# Patient Record
Sex: Female | Born: 1940 | Race: White | Hispanic: No | Smoking: Former smoker
Health system: Southern US, Community
[De-identification: ages and names within clinical notes are randomized; demographics above are authoritative.]

## PROBLEM LIST (undated history)

## (undated) DIAGNOSIS — J449 Chronic obstructive pulmonary disease, unspecified: Secondary | ICD-10-CM

## (undated) DIAGNOSIS — T8859XA Other complications of anesthesia, initial encounter: Secondary | ICD-10-CM

## (undated) DIAGNOSIS — Z7983 Long term (current) use of bisphosphonates: Secondary | ICD-10-CM

## (undated) DIAGNOSIS — C50919 Malignant neoplasm of unspecified site of unspecified female breast: Secondary | ICD-10-CM

## (undated) DIAGNOSIS — Z923 Personal history of irradiation: Secondary | ICD-10-CM

## (undated) DIAGNOSIS — E079 Disorder of thyroid, unspecified: Secondary | ICD-10-CM

## (undated) DIAGNOSIS — R0602 Shortness of breath: Secondary | ICD-10-CM

## (undated) DIAGNOSIS — I10 Essential (primary) hypertension: Secondary | ICD-10-CM

## (undated) DIAGNOSIS — T4145XA Adverse effect of unspecified anesthetic, initial encounter: Secondary | ICD-10-CM

## (undated) DIAGNOSIS — I4891 Unspecified atrial fibrillation: Secondary | ICD-10-CM

## (undated) DIAGNOSIS — C801 Malignant (primary) neoplasm, unspecified: Secondary | ICD-10-CM

## (undated) DIAGNOSIS — R062 Wheezing: Secondary | ICD-10-CM

## (undated) DIAGNOSIS — K219 Gastro-esophageal reflux disease without esophagitis: Secondary | ICD-10-CM

## (undated) DIAGNOSIS — M1990 Unspecified osteoarthritis, unspecified site: Secondary | ICD-10-CM

## (undated) HISTORY — DX: Essential (primary) hypertension: I10

## (undated) HISTORY — DX: Disorder of thyroid, unspecified: E07.9

## (undated) HISTORY — DX: Malignant (primary) neoplasm, unspecified: C80.1

## (undated) HISTORY — DX: Wheezing: R06.2

## (undated) HISTORY — DX: Unspecified atrial fibrillation: I48.91

## (undated) HISTORY — DX: Chronic obstructive pulmonary disease, unspecified: J44.9

## (undated) HISTORY — DX: Unspecified osteoarthritis, unspecified site: M19.90

---

## 1972-12-14 HISTORY — PX: ABDOMINAL HYSTERECTOMY: SHX81

## 1999-02-04 ENCOUNTER — Other Ambulatory Visit: Admission: RE | Admit: 1999-02-04 | Discharge: 1999-02-04 | Payer: Self-pay | Admitting: General Surgery

## 1999-02-19 ENCOUNTER — Ambulatory Visit (HOSPITAL_BASED_OUTPATIENT_CLINIC_OR_DEPARTMENT_OTHER): Admission: RE | Admit: 1999-02-19 | Discharge: 1999-02-19 | Payer: Self-pay | Admitting: General Surgery

## 1999-12-15 HISTORY — PX: ROTATOR CUFF REPAIR: SHX139

## 2000-05-17 ENCOUNTER — Encounter: Admission: RE | Admit: 2000-05-17 | Discharge: 2000-05-17 | Payer: Self-pay | Admitting: Internal Medicine

## 2000-05-17 ENCOUNTER — Encounter: Payer: Self-pay | Admitting: Internal Medicine

## 2001-05-30 ENCOUNTER — Encounter: Payer: Self-pay | Admitting: Internal Medicine

## 2001-05-30 ENCOUNTER — Encounter: Admission: RE | Admit: 2001-05-30 | Discharge: 2001-05-30 | Payer: Self-pay | Admitting: Internal Medicine

## 2002-05-31 ENCOUNTER — Encounter: Payer: Self-pay | Admitting: Internal Medicine

## 2002-05-31 ENCOUNTER — Encounter: Admission: RE | Admit: 2002-05-31 | Discharge: 2002-05-31 | Payer: Self-pay | Admitting: Internal Medicine

## 2003-03-28 ENCOUNTER — Inpatient Hospital Stay (HOSPITAL_COMMUNITY): Admission: EM | Admit: 2003-03-28 | Discharge: 2003-03-30 | Payer: Self-pay | Admitting: Cardiology

## 2007-12-09 ENCOUNTER — Ambulatory Visit (HOSPITAL_COMMUNITY): Admission: RE | Admit: 2007-12-09 | Discharge: 2007-12-09 | Payer: Self-pay | Admitting: Obstetrics and Gynecology

## 2007-12-09 ENCOUNTER — Encounter (INDEPENDENT_AMBULATORY_CARE_PROVIDER_SITE_OTHER): Payer: Self-pay | Admitting: Interventional Radiology

## 2007-12-15 HISTORY — PX: UTERINE FIBROID SURGERY: SHX826

## 2007-12-15 HISTORY — PX: OTHER SURGICAL HISTORY: SHX169

## 2008-09-18 ENCOUNTER — Encounter: Admission: RE | Admit: 2008-09-18 | Discharge: 2008-09-18 | Payer: Self-pay | Admitting: Internal Medicine

## 2009-04-09 ENCOUNTER — Ambulatory Visit (HOSPITAL_COMMUNITY): Admission: RE | Admit: 2009-04-09 | Discharge: 2009-04-09 | Payer: Self-pay | Admitting: Internal Medicine

## 2009-04-09 LAB — PULMONARY FUNCTION TEST

## 2009-09-25 ENCOUNTER — Encounter: Admission: RE | Admit: 2009-09-25 | Discharge: 2009-09-25 | Payer: Self-pay | Admitting: Internal Medicine

## 2009-12-14 HISTORY — PX: HERNIA REPAIR: SHX51

## 2010-02-10 ENCOUNTER — Encounter: Admission: RE | Admit: 2010-02-10 | Discharge: 2010-02-10 | Payer: Self-pay | Admitting: Internal Medicine

## 2010-08-04 ENCOUNTER — Inpatient Hospital Stay (HOSPITAL_COMMUNITY): Admission: EM | Admit: 2010-08-04 | Discharge: 2010-08-18 | Payer: Self-pay | Admitting: Emergency Medicine

## 2010-08-04 ENCOUNTER — Encounter: Admission: RE | Admit: 2010-08-04 | Discharge: 2010-08-04 | Payer: Self-pay | Admitting: Internal Medicine

## 2010-09-29 ENCOUNTER — Encounter: Admission: RE | Admit: 2010-09-29 | Discharge: 2010-09-29 | Payer: Self-pay | Admitting: Internal Medicine

## 2010-12-14 HISTORY — PX: BREAST LUMPECTOMY: SHX2

## 2010-12-25 ENCOUNTER — Ambulatory Visit
Admission: RE | Admit: 2010-12-25 | Discharge: 2010-12-25 | Payer: Self-pay | Source: Home / Self Care | Attending: Pulmonary Disease | Admitting: Pulmonary Disease

## 2010-12-25 ENCOUNTER — Encounter: Payer: Self-pay | Admitting: Pulmonary Disease

## 2010-12-25 ENCOUNTER — Encounter
Admission: RE | Admit: 2010-12-25 | Discharge: 2010-12-25 | Payer: Self-pay | Source: Home / Self Care | Attending: Geriatric Medicine | Admitting: Geriatric Medicine

## 2010-12-25 DIAGNOSIS — E785 Hyperlipidemia, unspecified: Secondary | ICD-10-CM | POA: Insufficient documentation

## 2010-12-25 DIAGNOSIS — A310 Pulmonary mycobacterial infection: Secondary | ICD-10-CM | POA: Insufficient documentation

## 2010-12-25 DIAGNOSIS — I10 Essential (primary) hypertension: Secondary | ICD-10-CM | POA: Insufficient documentation

## 2010-12-25 DIAGNOSIS — J309 Allergic rhinitis, unspecified: Secondary | ICD-10-CM | POA: Insufficient documentation

## 2010-12-25 DIAGNOSIS — J479 Bronchiectasis, uncomplicated: Secondary | ICD-10-CM | POA: Insufficient documentation

## 2010-12-25 DIAGNOSIS — R05 Cough: Secondary | ICD-10-CM | POA: Insufficient documentation

## 2010-12-25 DIAGNOSIS — R053 Chronic cough: Secondary | ICD-10-CM | POA: Insufficient documentation

## 2011-01-15 NOTE — Assessment & Plan Note (Signed)
Summary: consult for bronchiectasis and cough   Copy to:  Hal Stoneking Primary Provider/Referring Provider:  Dr. Earl Gala  CC:  Pulmonary Consult.  History of Present Illness: The pt is a 69y/o female who I have been asked to see for cough and abnormal ct chest.  The pt states that she gets a "cold" every Nov/Dec, then usually occurs again in Feb/Mar.  This past Dec, she felt the cough worsened, and was associated with purulent scant mucus and chest congestion.  She did not have any breathing issues above usual baseline with exertional activities.  Her cough has continued to be an issue, and can occur in paroxysms at times.  She has a definite globus sensation, and admits to having frequent throat clearing.  She does have a h/o GERD, and has ongoing symptoms at times.  She denies any postnasal drip.  She has had pfts in the past per her history, and told she has COPD.  She is on spiriva and symbicort for this.  She has had a recent ct chest for ongoing symptoms, and found to have bronchiectasis with tree in bud densities in RUL, RML, and lingula.  There was minimal LN, and calcifications were noted in the spleen.  She did report she lived in [place] for a few months, but denies any h/o TB exposure.  Preventive Screening-Counseling & Management  Alcohol-Tobacco     Smoking Status: quit  Current Medications (verified): 1)  Fish Oil Double Strength 1200 Mg Caps (Omega-3 Fatty Acids) .... Take 1 Tablet By Mouth Two Times A Day 2)  Multivitamins  Tabs (Multiple Vitamin) .... Take 1 Tablet By Mouth Once A Day 3)  Aspirin Low Dose 81 Mg Tabs (Aspirin) .... Take 1 Tablet By Mouth Once A Day 4)  Simvastatin 10 Mg Tabs (Simvastatin) .... Take 1 Tablet By Mouth Once A Day 5)  Spiriva Handihaler 18 Mcg  Caps (Tiotropium Bromide Monohydrate) .... Inhale Contents of 1 Capsule Once A Day 6)  Hydrochlorothiazide 25 Mg Tabs (Hydrochlorothiazide) .... Take 1 Tablet By Mouth Once A Day 7)  Amlodipine Besylate 5  Mg Tabs (Amlodipine Besylate) .... Take 1 Tablet By Mouth Once A Day 8)  Vitamin D3 2000 Unit Caps (Cholecalciferol) .... Take 1 Tablet By Mouth Once A Day  Allergies (verified): 1)  ! Codeine 2)  ! * Novacaine  Past History:  Past Medical History: Allergic Rhinitis Hyperlipidemia Hypertension COPD  Past Surgical History: multiple breast biopsies partial hysterectomy 1974 hernia repair 08-05-2010 pelvic tumors 01-31-2008 L rotator cuff 2001  Family History: Reviewed history and no changes required. heart disease: father cancer: mother (liver)   Social History: Reviewed history and no changes required. Patient states former smoker.  started at age 36.  less than 1/2 ppd.  quit Dec 11, 2009. pt is married and lives with Blanco. pt has children. pt is self-employed.  Smoking Status:  quit  Review of Systems       The patient complains of shortness of breath with activity, productive cough, non-productive cough, indigestion, and joint stiffness or pain.  The patient denies shortness of breath at rest, coughing up blood, chest pain, irregular heartbeats, acid heartburn, loss of appetite, weight change, abdominal pain, difficulty swallowing, sore throat, tooth/dental problems, headaches, nasal congestion/difficulty breathing through nose, sneezing, itching, ear ache, anxiety, depression, hand/feet swelling, rash, change in color of mucus, and fever.    Vital Signs:  Patient profile:   70 year old female Height:      65 inches  Weight:      143 pounds BMI:     23.88 O2 Sat:      95 % on Room air Temp:     98.4 degrees F oral Pulse rate:   80 / minute BP sitting:   134 / 62  (right arm) Cuff size:   regular  Vitals Entered By: Arman Filter LPN (December 25, 2010 3:29 PM)  O2 Flow:  Room air CC: Pulmonary Consult Comments Medications reviewed with patient Arman Filter LPN  December 25, 2010 3:37 PM    Physical Exam  General:  wd female in nad Eyes:  PERRLA and EOMI.    Nose:  patent without discharge Mouth:  clear with no exudates or lesions seen Neck:  no jvd, tmg, LN Lungs:  clear to auscultation, no wheezing or rhonchi Heart:  rrr, no mrg Abdomen:  soft and nontender, bs+ Extremities:  no edema or cyanosis  pulses intact distally Neurologic:  alert and oriented, moves all 4.   Impression & Recommendations:  Problem # 1:  COUGH (ICD-786.2) the pt's current cough is more c/w a cyclical cough.  She has had a recent infection which started the process, and now is being propagated by her throat clearing and possibly GERD.  She feels her cough is much improved, and I have reviewed various behavioral techniques with her that should help it resolve completely.  She is to let me know if this continues to be an issue.  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  I believe this is colonization rather than true infection, given she does not have worsening constitutional symptoms or large quantities of mucus production.  At this point, there is really not a lot to do differently.  The pt understands she may be at risk of more frequent infections, and that she may require more broad spectrum antibiotics than typically used for community acquired infections.  It is very unlikely her MAC will become more of an issue, but certainly is possible.  I would be happy to see her again if issues arise.    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  See above.  Medications Added to Medication List This Visit: 1)  Fish Oil Double Strength 1200 Mg Caps (Omega-3 fatty acids) .... Take 1 tablet by mouth two times a day 2)  Multivitamins Tabs (Multiple vitamin) .... Take 1 tablet by mouth once a day 3)  Aspirin Low Dose 81 Mg Tabs (Aspirin) .... Take 1 tablet by mouth once a day 4)  Simvastatin 10 Mg Tabs  (Simvastatin) .... Take 1 tablet by mouth once a day 5)  Spiriva Handihaler 18 Mcg Caps (Tiotropium bromide monohydrate) .... Inhale contents of 1 capsule once a day 6)  Hydrochlorothiazide 25 Mg Tabs (Hydrochlorothiazide) .... Take 1 tablet by mouth once a day 7)  Amlodipine Besylate 5 Mg Tabs (Amlodipine besylate) .... Take 1 tablet by mouth once a day 8)  Vitamin D3 2000 Unit Caps (Cholecalciferol) .... Take 1 tablet by mouth once a day  Other Orders: Consultation Level V (13244)  Patient Instructions: 1)  limit voice use as much as possible until cough is better. 2)  NO throat clearing.  Keep hard candy in mouth all day until cough is better.  No mint or menthol 3)  use tessalon pearls to help with cough suppression. 4)  would not recommend any change in treatment for your chest xray unless your symptoms change dramatically. 5)  followup with me as needed.

## 2011-01-23 NOTE — Discharge Summary (Signed)
  NAMECARMEL, GARFIELD NO.:  [id number]  MEDICAL RECORD NO.:  [id number]          PATIENT TYPE:  INP  LOCATION:  5149                         FACILITY:  [facility]  PHYSICIAN:  Wilmon Arms. Corliss Skains, M.D. DATE OF BIRTH:  05/12/1941  DATE OF ADMISSION:  08/04/2010 DATE OF DISCHARGE:  08/18/2010                              DISCHARGE SUMMARY   HISTORY OF PRESENT ILLNESS:  Ms. Petrucci is a 70 year old female who developed some abdominal discomfort, she was brought to the emergency department where CT scan was performed, CT showed scan showed evidence of incarcerated ventral hernia with omentum and intestinal contents. The patient was seen and evaluated by Dr. Freida Busman who evaluated the patient and did discuss the need for urgent surgical repair and reduction.  After informed consent, decision was made to admit the patient for operative management.  SUMMARY OF HOSPITAL COURSE:  The patient was admitted on August 04, 2010.  She was taken to the operating room on the same day and underwent laparoscopic incisional hernia repair with mesh.  The patient was taken to the floor in stable condition.  Postoperatively, the patient unfortunately developed an ileus with hypokalemia.  Her electrolytes were replaced and the patient had an NG tube placed to low wall suction. This ileus persisted for several days with the patient making very limited progress.  However, eventually she had evidence of improvement by means of early bowel function, which she was starting to pass a little bit of flatus and have few small loose bowel movements.  She was started on Reglan and again showed continued improvement.  However, due to the fact that the patient's ileus was so prolonged, the CT scan was ordered.  The CT scan showed evidence of bowel obstruction versus ileus. Decision was made to maintain conservative treatment including TNA and bowel rest if necessary.  Followup x-rays again showed evidence  of ileus and bowel obstruction.  However, she continued to make slow progress. Her NG tube was discontinued and she was finally started on liquid diet and advanced to a more thick diet, eventually on solid foods.  She got to the point where she was not having any nausea, vomiting, or abdominal distention, moving bowels regularly, tolerating diet.  At this point on August 18, 2010, we felt the patient was appropriate for discharge home.  DISCHARGE DIAGNOSES: 1. Incarcerated ventral hernia, status post laparoscopic incisional     hernia repair. 2. Postoperative ileus.  PLAN:  The patient will be given preprinted discharge instructions to follow.  She is asked to follow up with Dr. Freida Busman in the office in approximately 10-14 days' time.  She was given a prescription for Vicodin 5/500 one to two tablets q.6 h. p.r.n. pain.     Brayton El, PA-C   ______________________________ Wilmon Arms. Corliss Skains, M.D.    Corky Downs  D:  01/19/2011  T:  01/20/2011  Job:  [id number]  Electronically Signed by Brayton El  on 01/21/2011 09:30:34 AM Electronically Signed by Manus Rudd M.D. on 01/23/2011 04:08:36 PM

## 2011-02-26 LAB — BASIC METABOLIC PANEL
BUN: 2 mg/dL — ABNORMAL LOW (ref 6–23)
BUN: 2 mg/dL — ABNORMAL LOW (ref 6–23)
BUN: 2 mg/dL — ABNORMAL LOW (ref 6–23)
BUN: 3 mg/dL — ABNORMAL LOW (ref 6–23)
BUN: 4 mg/dL — ABNORMAL LOW (ref 6–23)
BUN: 6 mg/dL (ref 6–23)
BUN: 8 mg/dL (ref 6–23)
CO2: 26 mEq/L (ref 19–32)
CO2: 27 mEq/L (ref 19–32)
CO2: 27 mEq/L (ref 19–32)
CO2: 28 mEq/L (ref 19–32)
CO2: 28 mEq/L (ref 19–32)
CO2: 33 mEq/L — ABNORMAL HIGH (ref 19–32)
Calcium: 8.1 mg/dL — ABNORMAL LOW (ref 8.4–10.5)
Calcium: 8.4 mg/dL (ref 8.4–10.5)
Calcium: 8.6 mg/dL (ref 8.4–10.5)
Calcium: 8.7 mg/dL (ref 8.4–10.5)
Calcium: 9.3 mg/dL (ref 8.4–10.5)
Chloride: 101 mEq/L (ref 96–112)
Chloride: 107 mEq/L (ref 96–112)
Chloride: 89 mEq/L — ABNORMAL LOW (ref 96–112)
Chloride: 98 mEq/L (ref 96–112)
Chloride: 98 mEq/L (ref 96–112)
Creatinine, Ser: 0.44 mg/dL (ref 0.4–1.2)
Creatinine, Ser: 0.54 mg/dL (ref 0.4–1.2)
Creatinine, Ser: 0.55 mg/dL (ref 0.4–1.2)
Creatinine, Ser: 0.58 mg/dL (ref 0.4–1.2)
GFR calc Af Amer: >60 mL/min (ref >60)
GFR calc Af Amer: >60 mL/min (ref >60)
GFR calc Af Amer: >60 mL/min (ref >60)
GFR calc Af Amer: >60 mL/min (ref >60)
GFR calc non Af Amer: >60 mL/min (ref >60)
GFR calc non Af Amer: >60 mL/min (ref >60)
GFR calc non Af Amer: >60 mL/min (ref >60)
GFR calc non Af Amer: >60 mL/min (ref >60)
Glucose, Bld: 134 mg/dL — ABNORMAL HIGH (ref 70–99)
Glucose, Bld: 134 mg/dL — ABNORMAL HIGH (ref 70–99)
Glucose, Bld: 140 mg/dL — ABNORMAL HIGH (ref 70–99)
Glucose, Bld: 141 mg/dL — ABNORMAL HIGH (ref 70–99)
Glucose, Bld: 185 mg/dL — ABNORMAL HIGH (ref 70–99)
Potassium: 3.1 mEq/L — ABNORMAL LOW (ref 3.5–5.1)
Potassium: 3.2 mEq/L — ABNORMAL LOW (ref 3.5–5.1)
Potassium: 2.8 mEq/L — ABNORMAL LOW (ref 3.5–5.1)
Potassium: 3 mEq/L — ABNORMAL LOW (ref 3.5–5.1)
Potassium: 3.6 mEq/L (ref 3.5–5.1)
Potassium: 3.7 mEq/L (ref 3.5–5.1)
Sodium: 130 mEq/L — ABNORMAL LOW (ref 135–145)
Sodium: 134 mEq/L — ABNORMAL LOW (ref 135–145)

## 2011-02-26 LAB — CBC
HCT: 28.6 % — ABNORMAL LOW (ref 36.0–46.0)
HCT: 32.8 % — ABNORMAL LOW (ref 36.0–46.0)
HCT: 41 % (ref 36.0–46.0)
Hemoglobin: 10.6 g/dL — ABNORMAL LOW (ref 12.0–15.0)
Hemoglobin: 9.4 g/dL — ABNORMAL LOW (ref 12.0–15.0)
MCH: 27 pg (ref 26.0–34.0)
MCH: 27.7 pg (ref 26.0–34.0)
MCH: 28 pg (ref 26.0–34.0)
MCHC: 32.3 g/dL (ref 30.0–36.0)
MCHC: 32.9 g/dL (ref 30.0–36.0)
MCHC: 33.7 g/dL (ref 30.0–36.0)
MCV: 83.5 fL (ref 78.0–100.0)
MCV: 81.9 fL (ref 78.0–100.0)
MCV: 82.1 fL (ref 78.0–100.0)
MCV: 83.2 fL (ref 78.0–100.0)
MCV: 83.5 fL (ref 78.0–100.0)
Platelets: 330 10*3/uL (ref 150–400)
Platelets: 189 10*3/uL (ref 150–400)
Platelets: 219 10*3/uL (ref 150–400)
RBC: 4.19 MIL/uL (ref 3.87–5.11)
RBC: 4.93 MIL/uL (ref 3.87–5.11)
RDW: 13.6 % (ref 11.5–15.5)
RDW: 13.5 % (ref 11.5–15.5)
WBC: 7 10*3/uL (ref 4.0–10.5)

## 2011-02-26 LAB — DIFFERENTIAL
Basophils Relative: 0 % (ref 0–1)
Lymphocytes Relative: 21 % (ref 12–46)
Monocytes Relative: 9 % (ref 3–12)
Neutrophils Relative %: 69 % (ref 43–77)

## 2011-02-26 LAB — PHOSPHORUS
Phosphorus: 2.9 mg/dL (ref 2.3–4.6)
Phosphorus: 3.1 mg/dL (ref 2.3–4.6)

## 2011-02-26 LAB — PROTIME-INR: Prothrombin Time: 12.6 seconds (ref 11.6–15.2)

## 2011-05-01 NOTE — Discharge Summary (Signed)
NAME:  Elizabeth Sparks, FEI NO.:  [id number]   MEDICAL RECORD NO.:  [id number]                   PATIENT TYPE:  INP   LOCATION:  3715                                 FACILITY:  [facility]   PHYSICIAN:  Olga Millers, M.D. [facility]            DATE OF BIRTH:  06-08-1941   DATE OF ADMISSION:  03/28/2003  DATE OF DISCHARGE:  03/29/2003                           DISCHARGE SUMMARY - REFERRING   HISTORY OF PRESENT ILLNESS:  The patient is a 70 year old white female who  presented to [facility] with new onset substernal chest discomfort that began  after lunch.  She described this as a heaviness and burning sensation in the  substernal area without radiation or associated nausea, vomiting, or  shortness of breath.  She thought it was different from the reflux that she  has had in the past.  She did have relieve with nitroglycerin, only the  discomfort came right back.  Her history is notable for tobacco use,  increased stress due to multiple deaths in her immediate family.  Lipid  status is unknown.   At [facility], hemoglobin 12.8, hematocrit 37.5, normal indices,  platelets 289, WBC 5.3.  Sodium 141, potassium 4.0, BUN 16, creatinine 0.7,  glucose 95.  CK total and MB and troponin negative for myocardial  infarction.  PTT 25.3, PT 12.0.   At [facility], Lipids were unremarkable except for an LDL of 119.   HOSPITAL COURSE:  The patient was initially seen at [facility] by Dr.  Andee Lineman and transferred to our facility to undergo cardiac catheterization.  She as transferred on April 14.  Overnight, she did not have any further  chest discomfort.  She ruled out for myocardial infarction at [facility].  Smoking cessation consult was performed.   Cardiac catheterization was performed on 03/30/2003 by Dr. Antoine Poche.  This  study showed a 30% mid RCA.  EF was 60%.  Dr. Antoine Poche noted that her  posterior leaflet had some mitral valve prolapse.  Dr. Antoine Poche  felt she had  nonobstructive coronary artery disease and that she did not need any further  workup and to remain on bed rest.  She did have a slight hematoma in the  right groin unassociated with ecchymosis or bruit.  After bed rest, she was  ambulating without difficulty.  Catheterization site was intact.  Thus, she  was discharged home.   DISCHARGE DIAGNOSES:  1. Noncardiac chest discomfort.  2. History as previously.   DISPOSITION:  She was asked to continue her multivitamins.  She was also  asked to begin a baby aspirin 81 mg daily.  She received a prescription for  Nexium 40 mg daily.  She was advised no lifting, driving, sexual activity,  or  heavy exertion for two day.  Maintain low-salt, low-fat, low-cholesterol  diet.  If she has any problems with the catheterization site, she was given  a number to call.  She was  advised no smoking or tobacco products and to  please call Dr. Sherril Croon to arrange a one to two-week followup appointment.     Joellyn Rued, P.A. [facility]                    Olga Millers, M.D. [facility]    EW/MEDQ  D:  03/30/2003  T:  03/30/2003  Job:  [id number]   cc:   Iran Ouch, M.D.   Learta Codding, M.D. [facility]  [address]  [address]  [place]  [place] [address]

## 2011-05-01 NOTE — Cardiovascular Report (Signed)
   NAME:  Elizabeth Sparks, Elizabeth Sparks NO.:  [id number]   MEDICAL RECORD NO.:  [id number]                   PATIENT TYPE:  INP   LOCATION:  3715                                 FACILITY:  [facility]   PHYSICIAN:  Rollene Rotunda, M.D. [facility]            DATE OF BIRTH:  1941-01-23   DATE OF PROCEDURE:  DATE OF DISCHARGE:                              CARDIAC CATHETERIZATION   PRIMARY CARE PHYSICIAN:  Doreen Beam, M.D.   PROCEDURES PERFORMED:  1. Left heart catheterization.  2. Coronary arteriography.   CARDIOLOGIST:  Rollene Rotunda, M.D.   INDICATIONS FOR PROCEDURE:  Evaluate patient with chest pain suggestive of  unstable angina.   DESCRIPTION OF PROCEDURE:  Left heart catheterization was performed via the  right femoral artery.  The artery was cannulated using an anterior wall  puncture.  A #6 French arterial sheath was inserted via the modified  Seldinger technique.  Preformed Judkins and a pigtail catheter were  utilized.   The patient tolerated the procedure well and left the lab in stable  condition.   RESULTS:   HEMODYNAMIC DATA:  LV 154/11.  AO 153/62.   CORONARIES:  Left Main Coronary Artery:  The left main was normal.   Left Anterior Descending:  The LAD had proximal calcifications.  There were  mid diffuse 25% lesions.  The circumflex was very small.  In the AV groove  there were luminal irregularities.  There was a long, but narrow first  obtuse marginal, which had luminal irregularities.   Right Coronary Artery:  The right coronary artery was a very large dominant  vessel.  It had mid long 30% stenosis.   LEFT VENTRICULOGRAM:  The left ventriculogram was obtained in the RAO  projection.  The EF was 60% with normal wall motion.   CONCLUSION:  1. Nonobstructive coronary artery disease.  2. Normal left ventricular function.    PLAN:  1. No further cardiovascular testing is suggested.  2. Should follow back with Dr. Sherril Croon for evaluation of  nonanginal chest pain.                                                 Rollene Rotunda, M.D. [facility]    JH/MEDQ  D:  03/30/2003  T:  04/01/2003  Job:  [id number]   cc:   Doreen Beam  [address]  [place]  [place] [address]  Fax: [phone]   [facility]  [place],  [place]

## 2011-09-14 ENCOUNTER — Other Ambulatory Visit: Payer: Self-pay | Admitting: Internal Medicine

## 2011-09-14 DIAGNOSIS — Z1231 Encounter for screening mammogram for malignant neoplasm of breast: Secondary | ICD-10-CM

## 2011-09-18 LAB — CBC
HCT: 41.5
Hemoglobin: 14.1
MCHC: 33.9
MCV: 85.5
Platelets: 241
RDW: 12.9

## 2011-10-05 ENCOUNTER — Ambulatory Visit
Admission: RE | Admit: 2011-10-05 | Discharge: 2011-10-05 | Disposition: A | Discharge status: Home / Self Care | Payer: 59 | Source: Ambulatory Visit | Attending: Internal Medicine | Admitting: Internal Medicine

## 2011-10-05 DIAGNOSIS — Z1231 Encounter for screening mammogram for malignant neoplasm of breast: Secondary | ICD-10-CM

## 2011-10-07 ENCOUNTER — Other Ambulatory Visit: Payer: Self-pay | Admitting: Internal Medicine

## 2011-10-07 DIAGNOSIS — R928 Other abnormal and inconclusive findings on diagnostic imaging of breast: Secondary | ICD-10-CM

## 2011-10-27 ENCOUNTER — Other Ambulatory Visit: Payer: Self-pay | Admitting: Internal Medicine

## 2011-10-27 ENCOUNTER — Other Ambulatory Visit: Payer: Self-pay | Admitting: Radiology

## 2011-10-27 ENCOUNTER — Ambulatory Visit
Admission: RE | Admit: 2011-10-27 | Discharge: 2011-10-27 | Disposition: A | Discharge status: Home / Self Care | Payer: 59 | Source: Ambulatory Visit | Attending: Internal Medicine | Admitting: Internal Medicine

## 2011-10-27 DIAGNOSIS — R928 Other abnormal and inconclusive findings on diagnostic imaging of breast: Secondary | ICD-10-CM

## 2011-10-28 ENCOUNTER — Other Ambulatory Visit: Payer: Self-pay | Admitting: Internal Medicine

## 2011-10-28 ENCOUNTER — Ambulatory Visit
Admission: RE | Admit: 2011-10-28 | Discharge: 2011-10-28 | Disposition: A | Discharge status: Home / Self Care | Payer: Medicare Other | Source: Ambulatory Visit | Attending: Internal Medicine | Admitting: Internal Medicine

## 2011-10-28 DIAGNOSIS — R928 Other abnormal and inconclusive findings on diagnostic imaging of breast: Secondary | ICD-10-CM

## 2011-10-28 DIAGNOSIS — C50911 Malignant neoplasm of unspecified site of right female breast: Secondary | ICD-10-CM

## 2011-10-29 ENCOUNTER — Encounter (INDEPENDENT_AMBULATORY_CARE_PROVIDER_SITE_OTHER): Payer: Self-pay | Admitting: General Surgery

## 2011-10-29 ENCOUNTER — Ambulatory Visit (INDEPENDENT_AMBULATORY_CARE_PROVIDER_SITE_OTHER): Payer: 59 | Admitting: General Surgery

## 2011-10-29 VITALS — BP 162/70 | HR 78 | Temp 97.5°F | Ht 63.0 in | Wt 146.2 lb

## 2011-10-29 DIAGNOSIS — C50912 Malignant neoplasm of unspecified site of left female breast: Secondary | ICD-10-CM

## 2011-10-29 DIAGNOSIS — C50919 Malignant neoplasm of unspecified site of unspecified female breast: Secondary | ICD-10-CM

## 2011-10-29 NOTE — Patient Instructions (Addendum)
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 You will be tentatively scheduled for a right partial mastectomy with needle localization and left sentinel lymph node biopsy. You will get an MRI preoperatively this coming Monday. We need to talk on the phone about the MRI before going ahead with the surgery to make sure that there are no findings on the MRI that would change our treatment plan. You will be referred to a medical oncologist and a radiation oncologist postoperatively.  Lumpectomy, Breast Conserving Surgery Care After Please read the instructions outlined below and refer to this sheet in the next few weeks. These discharge instructions provide you with general information on caring for yourself after you leave the hospital. Your surgeon may also give you specific instructions. While your treatment has been planned according to the most current medical practices available, unavoidable complications occasionally occur. If you have any problems or questions after discharge, please call your surgeon. Reasons for a lumpectomy:  Any solid breast mass.   Grouped significant nodularity that may be confused with a solitary breast mass.  AFTER THE PROCEDURE  After surgery, you will be taken to the recovery area where a nurse will watch and check your progress. Once you're awake, stable, and taking fluids well, barring other problems you will be allowed to go home.   Ice packs applied to your operative site may help with discomfort and keep the swelling down.   A small rubber drain may be placed in the incision for a couple of days to prevent a hematoma in the breast.   A pressure dressing may be applied for 24 to 48 hours to prevent bleeding.   Keep the wound dry.   You may resume a normal diet and activities as directed. Avoid strenuous activities affecting the arm on the side of the biopsy site such as tennis, swimming, heavy lifting  (more than 10 pounds) or pulling.   Bruising in the breast is normal following this procedure.   Wearing a bra - even to bed - may be more comfortable and also help keep the dressing on.   Change dressings as directed.   Only take over-the-counter or prescription medicines for pain, discomfort, or fever as directed by your caregiver.  Call for your results as instructed by your surgeon. Remember it isyour responsibility to get the results of your lumpectomy if your surgeon asked you to follow-up. Do not assume everything is fine if you have not heard from your caregiver. SEEK MEDICAL CARE IF:   There is increased bleeding (more than a small spot) from the wound.   You notice redness, swelling, or increasing pain in the wound.   Pus is coming from wound.   An unexplained oral temperature above 102 F (38.9 C) develops.   You notice a foul smell coming from the wound or dressing.  SEEK IMMEDIATE MEDICAL CARE IF:   You develop a rash.   You have difficulty breathing.   You have any allergic problems.  Document Released: 12/16/2006 Document Revised: 08/12/2011 Document Reviewed: 11/18/2007 [facility] Patient Information 2012 [place], [place].

## 2011-10-29 NOTE — Progress Notes (Signed)
Patient ID: Elizabeth Sparks, female   DOB: 12-17-1940, 70 y.o.   MRN: [id number]  Chief Complaint  Patient presents with  . Other    eval br ca    HPI Elizabeth Sparks is a 70 y.o. female.  She is referred to me by Dr. Anselmo Pickler at the breast center of [facility]  for evaluation of a newly diagnosed invasive ductal carcinoma of the right breast at the 12:00 position.  The patient had a left breast biopsy by me many years ago but that was benign. She has had no other breast problems. Recent screening mammography and ultrasound show an 8 mm mass in the right breast at the 12:00 position. Image guided biopsy shows that this is an invasive ductal carcinoma, grade 1-2. Breast diagnostic profile is pending. MRI is scheduled for November 02, 2011.  She wanted to be seen by me as soon as possible to develop a treatment plan. HPI  Past Medical History  Diagnosis Date  . Arthritis   . COPD (chronic obstructive pulmonary disease)   . Hypertension   . Thyroid disease   . Wheezing     Past Surgical History  Procedure Date  . Rotator cuff repair 2001    left  . Pelvic tumors 2009  . Hernia repair 2011    abd wall hernia    History reviewed. No pertinent family history.  Social History History  Substance Use Topics  . Smoking status: Former Smoker    Quit date: 10/28/2009  . Smokeless tobacco: Not on file  . Alcohol Use: 1.2 oz/week    2 Glasses of wine per week    Allergies  Allergen Reactions  . Codeine     REACTION: headache and nausea  . Procaine Hcl     REACTION: headaches    Current Outpatient Prescriptions  Medication Sig Dispense Refill  . amLODipine (NORVASC) 5 MG tablet Take 5 mg by mouth daily.        Marland Kitchen aspirin 81 MG tablet Take 81 mg by mouth daily.        . budesonide-formoterol (SYMBICORT) 160-4.5 MCG/ACT inhaler Inhale 2 puffs into the lungs 2 (two) times daily.        . Cholecalciferol (VITAMIN D-3 PO) Take by mouth daily.        . fish oil-omega-3  fatty acids 1000 MG capsule Take 2 g by mouth daily.        . Flaxseed, Linseed, (FLAX SEED OIL) 1000 MG CAPS Take 1,000 mg by mouth daily.        . hydrochlorothiazide (HYDRODIURIL) 25 MG tablet Take 25 mg by mouth daily.        . Multiple Vitamins-Minerals (MULTIVITAMIN WITH MINERALS) tablet Take 1 tablet by mouth daily.        . simvastatin (ZOCOR) 10 MG tablet Take 10 mg by mouth at bedtime.        Marland Kitchen tiotropium (SPIRIVA) 18 MCG inhalation capsule Place 18 mcg into inhaler and inhale daily.          Review of Systems Review of Systems  Constitutional: Negative for fever, chills and unexpected weight change.  HENT: Negative for hearing loss, congestion, sore throat, trouble swallowing and voice change.   Eyes: Negative for visual disturbance.  Respiratory: Positive for cough. Negative for wheezing.   Cardiovascular: Negative for chest pain, palpitations and leg swelling.  Gastrointestinal: Negative for nausea, vomiting, abdominal pain, diarrhea, constipation, blood in stool, abdominal distention and anal bleeding.  Genitourinary:  Negative for hematuria, vaginal bleeding and difficulty urinating.  Musculoskeletal: Negative for arthralgias.  Skin: Negative for rash and wound.  Neurological: Negative for seizures, syncope and headaches.  Hematological: Negative for adenopathy. Does not bruise/bleed easily.  Psychiatric/Behavioral: Negative for confusion.    Blood pressure 162/70, pulse 78, temperature 97.5 F (36.4 C), height 5\' 3"  (1.6 m), weight 146 lb 3.2 oz (66.316 kg), SpO2 97.00%.  Physical Exam Physical Exam  Constitutional: She is oriented to person, place, and time. She appears well-developed and well-nourished. No distress.  HENT:  Head: Normocephalic and atraumatic.  Nose: Nose normal.  Mouth/Throat: No oropharyngeal exudate.  Eyes: Conjunctivae and EOM are normal. Pupils are equal, round, and reactive to light. Left eye exhibits no discharge. No scleral icterus.  Neck:  Neck supple. No JVD present. No tracheal deviation present. No thyromegaly present.  Cardiovascular: Normal rate, regular rhythm, normal heart sounds and intact distal pulses.   No murmur heard. Pulmonary/Chest: Effort normal and breath sounds normal. No respiratory distress. She has no wheezes. She has no rales. She exhibits no tenderness.    Abdominal: Soft. Bowel sounds are normal. She exhibits no distension and no mass. There is no tenderness. There is no rebound and no guarding.    Musculoskeletal: She exhibits no edema and no tenderness.  Lymphadenopathy:    She has no cervical adenopathy.  Neurological: She is alert and oriented to person, place, and time. She exhibits normal muscle tone. Coordination normal.  Skin: Skin is warm. No rash noted. She is not diaphoretic. No erythema. No pallor.  Psychiatric: She has a normal mood and affect. Her behavior is normal. Judgment and thought content normal.    Data Reviewed I have reviewed her mammograms and ultrasound and pathology report. She is scheduled for MRI on November 02, 2011. I have reviewed her old hospital records from 2004 and 2011.  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 Breast diagnostic profile  pending.  Status post laparotomy for removal of benign ovarian tumor at [facility] 2009.  Status post laparoscopic repair of ventral incisional hernia with mesh by Dr. Manus Rudd. August 2011.  Status post cardiac catheterization 2004 with normal left ventricular function and nonobstructive coronary artery disease.  Remote history vaginal hysterectomy  COPD and asthma  Prior tobacco abuse, quit smoking December 2010.  Degenerative joint disease.    Plan    We had a long discussion about the management of breast cancer. We talked about lumpectomy and mastectomy. We talked that sentinel node biopsy. We talked about the use of radiation therapy,  chemotherapy, and antiestrogen therapy. We talked about the role of reconstructive surgery if mastectomy is chosen.  It was her desire to pursue breast conservation if possible and . she was an excellent candidate for that.  We are going to schedule her for right partial mastectomy with needle localization and right axillary sentinel lymph node mapping and biopsy.  I have offered to refer her to radiation oncology and medical oncology preop, and she declines that stating that she will do that postop.  I discussed the indications and details of surgery with her and her husband. Risks and complications have been outlined, including but not limited to bleeding, infection, reoperation for positive margins, reoperation for positive nodes, skin necrosis, cosmetic deformity, arm swelling, or numbness, cardiac pulmonary and thromboembolic problems. She seems to understand these issues well. All of her questions were answered. She is in agreement with  this plan.  She will call me or I will call her next week to discuss the findings of the MRI, to be sure that the MRI findings do not alter our treatment plan.       Lenka Zhao M 10/29/2011, 4:17 PM

## 2011-11-02 ENCOUNTER — Other Ambulatory Visit (INDEPENDENT_AMBULATORY_CARE_PROVIDER_SITE_OTHER): Payer: Self-pay | Admitting: General Surgery

## 2011-11-02 ENCOUNTER — Ambulatory Visit
Admission: RE | Admit: 2011-11-02 | Discharge: 2011-11-02 | Disposition: A | Discharge status: Home / Self Care | Payer: Medicare Other | Source: Ambulatory Visit | Attending: Internal Medicine | Admitting: Internal Medicine

## 2011-11-02 DIAGNOSIS — C50919 Malignant neoplasm of unspecified site of unspecified female breast: Secondary | ICD-10-CM

## 2011-11-02 DIAGNOSIS — C50911 Malignant neoplasm of unspecified site of right female breast: Secondary | ICD-10-CM

## 2011-11-02 MED ORDER — GADOBENATE DIMEGLUMINE 529 MG/ML IV SOLN
13.0000 mL | Freq: Once | INTRAVENOUS | Status: AC | PRN
  Administered 2011-11-02: 13 mL via INTRAVENOUS

## 2011-11-03 ENCOUNTER — Telehealth (INDEPENDENT_AMBULATORY_CARE_PROVIDER_SITE_OTHER): Payer: Self-pay

## 2011-11-03 NOTE — Telephone Encounter (Signed)
PT CALLED FOR MRI RESULT. REVIEWED WITH DR Derrell Lolling VIA PHONE. PT ADVISED OF MRI RESULT.

## 2011-11-12 ENCOUNTER — Other Ambulatory Visit (INDEPENDENT_AMBULATORY_CARE_PROVIDER_SITE_OTHER): Payer: Self-pay | Admitting: General Surgery

## 2011-11-12 DIAGNOSIS — C50911 Malignant neoplasm of unspecified site of right female breast: Secondary | ICD-10-CM

## 2011-11-18 ENCOUNTER — Encounter (HOSPITAL_BASED_OUTPATIENT_CLINIC_OR_DEPARTMENT_OTHER): Payer: Self-pay | Admitting: *Deleted

## 2011-11-18 ENCOUNTER — Telehealth (INDEPENDENT_AMBULATORY_CARE_PROVIDER_SITE_OTHER): Payer: Self-pay

## 2011-11-18 ENCOUNTER — Telehealth: Payer: Self-pay | Admitting: *Deleted

## 2011-11-18 NOTE — Telephone Encounter (Signed)
Pt lives in [place] and has requested to see Dr. Cleone Slim.  I have faxed her paperwork to [facility] and called [facility] to make them aware.

## 2011-11-18 NOTE — Progress Notes (Signed)
To come in for all preop tests [facility] with husband after nl

## 2011-11-18 NOTE — Telephone Encounter (Signed)
Elizabeth Sparks at [facility] in my VM to let us know that due to where pt lives pt has requested Dr Cleone Slim in [place] clinic. Misty Stanley has faxed all records and is helping with arranging this appt.

## 2011-11-19 ENCOUNTER — Other Ambulatory Visit: Payer: Self-pay

## 2011-11-19 ENCOUNTER — Ambulatory Visit: Payer: Medicare Other

## 2011-11-19 ENCOUNTER — Other Ambulatory Visit: Payer: Medicare Other

## 2011-11-19 ENCOUNTER — Ambulatory Visit
Admission: RE | Admit: 2011-11-19 | Discharge: 2011-11-19 | Disposition: A | Discharge status: Home / Self Care | Payer: 59 | Source: Ambulatory Visit | Attending: General Surgery | Admitting: General Surgery

## 2011-11-19 ENCOUNTER — Ambulatory Visit: Payer: Medicare Other | Admitting: Oncology

## 2011-11-19 ENCOUNTER — Encounter (HOSPITAL_BASED_OUTPATIENT_CLINIC_OR_DEPARTMENT_OTHER)
Admission: RE | Admit: 2011-11-19 | Discharge: 2011-11-19 | Disposition: A | Discharge status: Home / Self Care | Payer: 59 | Source: Ambulatory Visit | Attending: General Surgery | Admitting: General Surgery

## 2011-11-19 LAB — URINALYSIS, ROUTINE W REFLEX MICROSCOPIC
Glucose, UA: 100 mg/dL — AB
Hgb urine dipstick: NEGATIVE
Ketones, ur: NEGATIVE mg/dL
Protein, ur: NEGATIVE mg/dL
Urobilinogen, UA: 1 mg/dL (ref 0.0–1.0)

## 2011-11-19 LAB — COMPREHENSIVE METABOLIC PANEL
ALT: 25 U/L (ref 0–35)
Alkaline Phosphatase: 95 U/L (ref 39–117)
BUN: 13 mg/dL (ref 6–23)
CO2: 28 mEq/L (ref 19–32)
Chloride: 97 mEq/L (ref 96–112)
GFR calc Af Amer: >90 mL/min (ref >90)
GFR calc non Af Amer: >90 mL/min (ref >90)
Glucose, Bld: 111 mg/dL — ABNORMAL HIGH (ref 70–99)
Potassium: 3.1 mEq/L — ABNORMAL LOW (ref 3.5–5.1)
Sodium: 137 mEq/L (ref 135–145)
Total Bilirubin: 0.4 mg/dL (ref 0.3–1.2)
Total Protein: 7.4 g/dL (ref 6.0–8.3)

## 2011-11-19 LAB — CBC
HCT: 41.7 % (ref 36.0–46.0)
Hemoglobin: 14.2 g/dL (ref 12.0–15.0)
RBC: 4.99 MIL/uL (ref 3.87–5.11)
WBC: 6 10*3/uL (ref 4.0–10.5)

## 2011-11-19 LAB — DIFFERENTIAL
Basophils Relative: 0 % (ref 0–1)
Eosinophils Absolute: 0.1 10*3/uL (ref 0.0–0.7)
Eosinophils Relative: 2 % (ref 0–5)
Lymphs Abs: 1.6 10*3/uL (ref 0.7–4.0)
Neutrophils Relative %: 63 % (ref 43–77)

## 2011-11-19 LAB — URINE MICROSCOPIC-ADD ON

## 2011-11-19 LAB — CANCER ANTIGEN 27.29: CA 27.29: 27 U/mL (ref 0–39)

## 2011-11-19 NOTE — H&P (Signed)
Elizabeth Sparks   10/29/2011 4:45 PM Office Visit  MRN: [id number]   Description: 70 year old female  Provider: Ernestene Mention, MD  Department: [facility]        Diagnoses     Cancer of  breast   - Primary    174.9      Reason for Visit     Other    eval br ca        Vitals - Last Recorded       BP Pulse Temp Ht Wt BMI    162/70  78  97.5 F (36.4 C)  5\' 3"  (1.6 m)  146 lb 3.2 oz (66.316 kg)  25.90 kg/m2          SpO2 =  97%                 Progress Notes     Ernestene Mention, MD  10/29/2011  4:34 PM  Signed Patient ID: Elizabeth Sparks, female   DOB: February 10, 1941, 70 y.o.   MRN: [id number]    Chief Complaint   Patient presents with   .  New right breast cancer    HPI Elizabeth Sparks is a 70 y.o. female.  She is referred to me by Dr. Anselmo Pickler at the breast center of [facility]  for evaluation of a newly diagnosed invasive ductal carcinoma of the right breast at the 12:00 position.   The patient had a left breast biopsy by me many years ago but that was benign. She has had no other breast problems. Recent screening mammography and ultrasound show an 8 mm mass in the right breast at the 12:00 position. Image guided biopsy shows that this is an invasive ductal carcinoma, grade 1-2. Breast diagnostic profile is pending. MRI is scheduled for November 02, 2011.   She wanted to be seen by me as soon as possible to develop a treatment plan. HPI    Past Medical History   Diagnosis  Date   .  Arthritis     .  COPD (chronic obstructive pulmonary disease)     .  Hypertension     .  Thyroid disease     .  Wheezing         Past Surgical History   Procedure  Date   .  Rotator cuff repair  2001       left   .  Pelvic tumors  2009   .  Hernia repair  2011       abd wall hernia      History reviewed. No pertinent family history.   Social History History   Substance Use Topics   .  Smoking status:  Former Smoker       Quit date:  10/28/2009    .  Smokeless tobacco:  Not on file   .  Alcohol Use:  1.2 oz/week       2 Glasses of wine per week       Allergies   Allergen  Reactions   .  Codeine         REACTION: headache and nausea   .  Procaine Hcl         REACTION: headaches       Current Outpatient Prescriptions   Medication  Sig  Dispense  Refill   .  amLODipine (NORVASC) 5 MG tablet  Take 5 mg by mouth daily.           Marland Kitchen  aspirin 81 MG tablet  Take 81 mg by mouth daily.           .  budesonide-formoterol (SYMBICORT) 160-4.5 MCG/ACT inhaler  Inhale 2 puffs into the lungs 2 (two) times daily.           .  Cholecalciferol (VITAMIN D-3 PO)  Take by mouth daily.           .  fish oil-omega-3 fatty acids 1000 MG capsule  Take 2 g by mouth daily.           .  Flaxseed, Linseed, (FLAX SEED OIL) 1000 MG CAPS  Take 1,000 mg by mouth daily.           .  hydrochlorothiazide (HYDRODIURIL) 25 MG tablet  Take 25 mg by mouth daily.           .  Multiple Vitamins-Minerals (MULTIVITAMIN WITH MINERALS) tablet  Take 1 tablet by mouth daily.           .  simvastatin (ZOCOR) 10 MG tablet  Take 10 mg by mouth at bedtime.           Marland Kitchen  tiotropium (SPIRIVA) 18 MCG inhalation capsule  Place 18 mcg into inhaler and inhale daily.              Review of Systems  Constitutional: Negative for fever, chills and unexpected weight change.  HENT: Negative for hearing loss, congestion, sore throat, trouble swallowing and voice change.   Eyes: Negative for visual disturbance.  Respiratory: Positive for cough. Negative for wheezing.   Cardiovascular: Negative for chest pain, palpitations and leg swelling.  Gastrointestinal: Negative for nausea, vomiting, abdominal pain, diarrhea, constipation, blood in stool, abdominal distention and anal bleeding.  Genitourinary: Negative for hematuria, vaginal bleeding and difficulty urinating.  Musculoskeletal: Negative for arthralgias.  Skin: Negative for rash and wound.  Neurological: Negative for seizures,  syncope and headaches.  Hematological: Negative for adenopathy. Does not bruise/bleed easily.  Psychiatric/Behavioral: Negative for confusion.    Blood pressure 162/70, pulse 78, temperature 97.5 F (36.4 C), height 5\' 3"  (1.6 m), weight 146 lb 3.2 oz (66.316 kg), SpO2 97.00%.   Physical Exam  Constitutional: She is oriented to person, place, and time. She appears well-developed and well-nourished. No distress.  HENT:   Head: Normocephalic and atraumatic.   Nose: Nose normal.   Mouth/Throat: No oropharyngeal exudate.  Eyes: Conjunctivae and EOM are normal. Pupils are equal, round, and reactive to light. Left eye exhibits no discharge. No scleral icterus.  Neck: Neck supple. No JVD present. No tracheal deviation present. No thyromegaly present.  Cardiovascular: Normal rate, regular rhythm, normal heart sounds and intact distal pulses.    No murmur heard. Pulmonary/Chest: Effort normal and breath sounds normal. No respiratory distress. She has no wheezes. She has no rales. She exhibits no tenderness.    Abdominal: Soft. Bowel sounds are normal. She exhibits no distension and no mass. There is no tenderness. There is no rebound and no guarding.    Musculoskeletal: She exhibits no edema and no tenderness.  Lymphadenopathy:    She has no cervical adenopathy.  Neurological: She is alert and oriented to person, place, and time. She exhibits normal muscle tone. Coordination normal.  Skin: Skin is warm. No rash noted. She is not diaphoretic. No erythema. No pallor.  Psychiatric: She has a normal mood and affect. Her behavior is normal. Judgment and thought content normal.    Data Reviewed I have reviewed her mammograms and  ultrasound and pathology report. She is scheduled for MRI on November 02, 2011. I have reviewed her old hospital records from 2004 and 2011.   Assessment Invasive ductal carcinoma right breast, 12:00 position, clinical stage I, T1b., N0. Breast diagnostic profile   pending.   Status post laparotomy for removal of benign ovarian tumor at [facility] 2009.   Status post laparoscopic repair of ventral incisional hernia with mesh by Dr. Manus Rudd. August 2011.   Status post cardiac catheterization 2004 with normal left ventricular function and nonobstructive coronary artery disease.   Remote history vaginal hysterectomy   COPD and asthma   Prior tobacco abuse, quit smoking December 2010.   Degenerative joint disease.   Plan We had a long discussion about the management of breast cancer. We talked about lumpectomy and mastectomy. We talked that sentinel node biopsy. We talked about the use of radiation therapy, chemotherapy, and antiestrogen therapy. We talked about the role of reconstructive surgery if mastectomy is chosen.   It was her desire to pursue breast conservation if possible and . she was an excellent candidate for that.   We are going to schedule her for right partial mastectomy with needle localization and right axillary sentinel lymph node mapping and biopsy.   I have offered to refer her to radiation oncology and medical oncology preop, and she declines that stating that she will do that postop.   I discussed the indications and details of surgery with her and her husband. Risks and complications have been outlined, including but not limited to bleeding, infection, reoperation for positive margins, reoperation for positive nodes, skin necrosis, cosmetic deformity, arm swelling, or numbness, cardiac pulmonary and thromboembolic problems. She seems to understand these issues well. All of her questions were answered. She is in agreement with this plan.   She will call me or I will call her next week to discuss the findings of the MRI, to be sure that the MRI findings do not alter our treatment plan.   Angelia Mould. Derrell Lolling, M.D., FACS  10/29/2011, 4:17 PM  Pending        Disp Refills Start End    heparin injection  5,000 Units     10/29/2011 10/29/2011    Route:  Subcutaneous    Class:  Normal    ceFAZolin (ANCEF) 1 g in dextrose 5 % 50 mL IVPB     10/29/2011      Route:  Intravenous    Class:  Normal      Patient Instructions     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 You will be tentatively scheduled for a right partial mastectomy with needle localization and left sentinel lymph node biopsy. You will get an MRI preoperatively this coming Monday. We need to talk on the phone about the MRI before going ahead with the surgery to make sure that there are no findings on the MRI that would change our treatment plan. You will be referred to a medical oncologist and a radiation oncologist postoperatively.   Lumpectomy, Breast Conserving Surgery Care After  Please read the instructions outlined below and refer to this sheet in the next few weeks. These discharge instructions provide you with general information on caring for yourself after you leave the hospital. Your surgeon may also give you specific instructions. While your treatment has been planned according to the most current medical practices available, unavoidable complications occasionally occur. If  you have any problems or questions after discharge, please call your surgeon. Reasons for a lumpectomy: Any solid breast mass.   Grouped significant nodularity that may be confused with a solitary breast mass.  AFTER THE PROCEDURE After surgery, you will be taken to the recovery area where a nurse will watch and check your progress. Once you're awake, stable, and taking fluids well, barring other problems you will be allowed to go home.   Ice packs applied to your operative site may help with discomfort and keep the swelling down.   A small rubber drain may be placed in the incision for a couple of days to prevent a hematoma in the breast.   A pressure dressing may be applied for 24 to 48  hours to prevent bleeding.   Keep the wound dry.   You may resume a normal diet and activities as directed. Avoid strenuous activities affecting the arm on the side of the biopsy site such as tennis, swimming, heavy lifting (more than 10 pounds) or pulling.   Bruising in the breast is normal following this procedure.   Wearing a bra - even to bed - may be more comfortable and also help keep the dressing on.   Change dressings as directed.   Only take over-the-counter or prescription medicines for pain, discomfort, or fever as directed by your caregiver.  Call for your results as instructed by your surgeon. Remember it isyour responsibility to get the results of your lumpectomy if your surgeon asked you to follow-up. Do not assume everything is fine if you have not heard from your caregiver. SEEK MEDICAL CARE IF:   There is increased bleeding (more than a small spot) from the wound.   You notice redness, swelling, or increasing pain in the wound.   Pus is coming from wound.   An unexplained oral temperature above 102 F (38.9 C) develops.   You notice a foul smell coming from the wound or dressing.  SEEK IMMEDIATE MEDICAL CARE IF:   You develop a rash.   You have difficulty breathing.   You have any allergic problems.  Document Released: 12/16/2006 Document Revised: 08/12/2011 Document Reviewed: 11/18/2007 [facility] Patient Information 2012 [place], [place].         Referring Provider          F. Rolla Plate         Other Encounter Related Information     Allergies & Medications         Problem List         History         Patient-Entered Questionnaires     No data filed

## 2011-11-20 ENCOUNTER — Encounter (HOSPITAL_BASED_OUTPATIENT_CLINIC_OR_DEPARTMENT_OTHER)
Admission: RE | Disposition: A | Discharge status: Home / Self Care | Payer: Self-pay | Source: Ambulatory Visit | Attending: General Surgery

## 2011-11-20 ENCOUNTER — Ambulatory Visit (HOSPITAL_BASED_OUTPATIENT_CLINIC_OR_DEPARTMENT_OTHER): Payer: 59 | Admitting: Anesthesiology

## 2011-11-20 ENCOUNTER — Other Ambulatory Visit (INDEPENDENT_AMBULATORY_CARE_PROVIDER_SITE_OTHER): Payer: Self-pay | Admitting: General Surgery

## 2011-11-20 ENCOUNTER — Encounter (HOSPITAL_BASED_OUTPATIENT_CLINIC_OR_DEPARTMENT_OTHER): Payer: Self-pay | Admitting: Anesthesiology

## 2011-11-20 ENCOUNTER — Ambulatory Visit
Admission: RE | Admit: 2011-11-20 | Discharge: 2011-11-20 | Disposition: A | Discharge status: Home / Self Care | Payer: 59 | Source: Ambulatory Visit | Attending: General Surgery | Admitting: General Surgery

## 2011-11-20 ENCOUNTER — Ambulatory Visit (HOSPITAL_BASED_OUTPATIENT_CLINIC_OR_DEPARTMENT_OTHER)
Admission: RE | Admit: 2011-11-20 | Discharge: 2011-11-20 | Disposition: A | Discharge status: Home / Self Care | Payer: 59 | Source: Ambulatory Visit | Attending: General Surgery | Admitting: General Surgery

## 2011-11-20 ENCOUNTER — Encounter (HOSPITAL_BASED_OUTPATIENT_CLINIC_OR_DEPARTMENT_OTHER): Payer: Self-pay | Admitting: *Deleted

## 2011-11-20 ENCOUNTER — Ambulatory Visit (HOSPITAL_COMMUNITY)
Admission: RE | Admit: 2011-11-20 | Discharge: 2011-11-20 | Disposition: A | Discharge status: Home / Self Care | Payer: 59 | Source: Ambulatory Visit | Attending: General Surgery | Admitting: General Surgery

## 2011-11-20 DIAGNOSIS — Z01810 Encounter for preprocedural cardiovascular examination: Secondary | ICD-10-CM | POA: Insufficient documentation

## 2011-11-20 DIAGNOSIS — C50919 Malignant neoplasm of unspecified site of unspecified female breast: Secondary | ICD-10-CM

## 2011-11-20 DIAGNOSIS — K219 Gastro-esophageal reflux disease without esophagitis: Secondary | ICD-10-CM | POA: Insufficient documentation

## 2011-11-20 DIAGNOSIS — C50912 Malignant neoplasm of unspecified site of left female breast: Secondary | ICD-10-CM

## 2011-11-20 DIAGNOSIS — C50911 Malignant neoplasm of unspecified site of right female breast: Secondary | ICD-10-CM

## 2011-11-20 DIAGNOSIS — Z01812 Encounter for preprocedural laboratory examination: Secondary | ICD-10-CM | POA: Insufficient documentation

## 2011-11-20 DIAGNOSIS — J449 Chronic obstructive pulmonary disease, unspecified: Secondary | ICD-10-CM | POA: Insufficient documentation

## 2011-11-20 DIAGNOSIS — I10 Essential (primary) hypertension: Secondary | ICD-10-CM | POA: Insufficient documentation

## 2011-11-20 DIAGNOSIS — J4489 Other specified chronic obstructive pulmonary disease: Secondary | ICD-10-CM | POA: Insufficient documentation

## 2011-11-20 HISTORY — DX: Adverse effect of unspecified anesthetic, initial encounter: T41.45XA

## 2011-11-20 HISTORY — DX: Other complications of anesthesia, initial encounter: T88.59XA

## 2011-11-20 HISTORY — PX: BREAST SURGERY: SHX581

## 2011-11-20 HISTORY — DX: Gastro-esophageal reflux disease without esophagitis: K21.9

## 2011-11-20 HISTORY — DX: Shortness of breath: R06.02

## 2011-11-20 SURGERY — BREAST LUMPECTOMY WITH SENTINEL LYMPH NODE BX
Anesthesia: General | Site: Breast | Laterality: Right | Wound class: Clean

## 2011-11-20 MED ORDER — BUPIVACAINE-EPINEPHRINE 0.5% -1:200000 IJ SOLN
INTRAMUSCULAR | Status: DC | PRN
  Administered 2011-11-20: 14 mL
  Administered 2011-11-20: 3 mL

## 2011-11-20 MED ORDER — METHYLENE BLUE 1 % INJ SOLN
INTRAMUSCULAR | Status: DC | PRN
  Administered 2011-11-20: 2 mL via SUBMUCOSAL

## 2011-11-20 MED ORDER — SODIUM CHLORIDE 0.9 % IJ SOLN
INTRAMUSCULAR | Status: DC | PRN
  Administered 2011-11-20: 3 mL via INTRAVENOUS

## 2011-11-20 MED ORDER — TECHNETIUM TC 99M SULFUR COLLOID FILTERED
1.0000 | Freq: Once | INTRAVENOUS | Status: AC | PRN
  Administered 2011-11-20: 1 via INTRADERMAL

## 2011-11-20 MED ORDER — FENTANYL CITRATE 0.05 MG/ML IJ SOLN
INTRAMUSCULAR | Status: DC | PRN
  Administered 2011-11-20: 50 ug via INTRAVENOUS

## 2011-11-20 MED ORDER — DEXAMETHASONE SODIUM PHOSPHATE 4 MG/ML IJ SOLN
INTRAMUSCULAR | Status: DC | PRN
  Administered 2011-11-20: 10 mg via INTRAVENOUS

## 2011-11-20 MED ORDER — MIDAZOLAM HCL 2 MG/2ML IJ SOLN
1.0000 mg | INTRAMUSCULAR | Status: DC | PRN
  Administered 2011-11-20: 1 mg via INTRAVENOUS

## 2011-11-20 MED ORDER — MIDAZOLAM HCL 2 MG/2ML IJ SOLN: 0.5000 mg | Freq: Once | INTRAMUSCULAR | Status: DC | PRN

## 2011-11-20 MED ORDER — HYDROCODONE-ACETAMINOPHEN 5-325 MG PO TABS: 1.0000 | ORAL_TABLET | ORAL | Status: AC | PRN

## 2011-11-20 MED ORDER — HEPARIN SODIUM (PORCINE) 5000 UNIT/ML IJ SOLN
5000.0000 [IU] | Freq: Once | INTRAMUSCULAR | Status: AC
  Administered 2011-11-20: 5000 [IU] via SUBCUTANEOUS

## 2011-11-20 MED ORDER — FENTANYL CITRATE 0.05 MG/ML IJ SOLN
50.0000 ug | INTRAMUSCULAR | Status: DC | PRN
  Administered 2011-11-20: 50 ug via INTRAVENOUS
  Administered 2011-11-20 (×2): 25 ug via INTRAVENOUS

## 2011-11-20 MED ORDER — PROPOFOL 10 MG/ML IV EMUL
INTRAVENOUS | Status: DC | PRN
  Administered 2011-11-20: 150 mg via INTRAVENOUS

## 2011-11-20 MED ORDER — CEFAZOLIN SODIUM 1-5 GM-% IV SOLN
1.0000 g | INTRAVENOUS | Status: AC
  Administered 2011-11-20: 1 g via INTRAVENOUS

## 2011-11-20 MED ORDER — ONDANSETRON HCL 4 MG/2ML IJ SOLN
INTRAMUSCULAR | Status: DC | PRN
  Administered 2011-11-20: 4 mg via INTRAVENOUS

## 2011-11-20 MED ORDER — LACTATED RINGERS IV SOLN
INTRAVENOUS | Status: DC
  Administered 2011-11-20: 13:00:00 via INTRAVENOUS

## 2011-11-20 SURGICAL SUPPLY — 68 items
ADH SKN CLS APL DERMABOND .7 (GAUZE/BANDAGES/DRESSINGS) ×2
APL SKNCLS STERI-STRIP NONHPOA (GAUZE/BANDAGES/DRESSINGS)
APPLIER CLIP 11 MED OPEN (CLIP) ×2
APR CLP MED 11 20 MLT OPN (CLIP) ×1
BANDAGE ELASTIC 6 VELCRO ST LF (GAUZE/BANDAGES/DRESSINGS) IMPLANT
BENZOIN TINCTURE PRP APPL 2/3 (GAUZE/BANDAGES/DRESSINGS) IMPLANT
BINDER BREAST LRG (GAUZE/BANDAGES/DRESSINGS) ×1 IMPLANT
BLADE HEX COATED 2.75 (ELECTRODE) ×2 IMPLANT
BLADE SURG 10 STRL SS (BLADE) ×1 IMPLANT
BLADE SURG 15 STRL LF DISP TIS (BLADE) ×2 IMPLANT
BLADE SURG 15 STRL SS (BLADE) ×4
CANISTER SUCTION 1200CC (MISCELLANEOUS) ×2 IMPLANT
CHLORAPREP W/TINT 26ML (MISCELLANEOUS) ×2 IMPLANT
CLIP APPLIE 11 MED OPEN (CLIP) IMPLANT
CLIP TI MEDIUM 6 (CLIP) ×2 IMPLANT
CLOTH BEACON ORANGE TIMEOUT ST (SAFETY) ×2 IMPLANT
COVER MAYO STAND STRL (DRAPES) ×2 IMPLANT
COVER PROBE W GEL 5X96 (DRAPES) ×2 IMPLANT
COVER TABLE BACK 60X90 (DRAPES) ×2 IMPLANT
DECANTER SPIKE VIAL GLASS SM (MISCELLANEOUS) IMPLANT
DERMABOND ADVANCED (GAUZE/BANDAGES/DRESSINGS) ×2
DERMABOND ADVANCED .7 DNX12 (GAUZE/BANDAGES/DRESSINGS) IMPLANT
DEVICE DUBIN W/COMP PLATE 8390 (MISCELLANEOUS) ×2 IMPLANT
DRAIN CHANNEL 19F RND (DRAIN) IMPLANT
DRAIN HEMOVAC 1/8 X 5 (WOUND CARE) IMPLANT
DRAPE LAPAROSCOPIC ABDOMINAL (DRAPES) ×2 IMPLANT
DRAPE UTILITY XL STRL (DRAPES) ×2 IMPLANT
DRSG PAD ABDOMINAL 8X10 ST (GAUZE/BANDAGES/DRESSINGS) IMPLANT
ELECT REM PT RETURN 9FT ADLT (ELECTROSURGICAL) ×2
ELECTRODE REM PT RTRN 9FT ADLT (ELECTROSURGICAL) ×1 IMPLANT
EVACUATOR SILICONE 100CC (DRAIN) IMPLANT
GAUZE SPONGE 4X4 12PLY STRL LF (GAUZE/BANDAGES/DRESSINGS) IMPLANT
GAUZE SPONGE 4X4 16PLY XRAY LF (GAUZE/BANDAGES/DRESSINGS) IMPLANT
GLOVE BIO SURGEON STRL SZ 6 (GLOVE) ×1 IMPLANT
GLOVE EUDERMIC 7 POWDERFREE (GLOVE) ×2 IMPLANT
GLOVE INDICATOR 6.5 STRL GRN (GLOVE) ×1 IMPLANT
GOWN PREVENTION PLUS XLARGE (GOWN DISPOSABLE) ×2 IMPLANT
GOWN PREVENTION PLUS XXLARGE (GOWN DISPOSABLE) ×2 IMPLANT
KIT MARKER MARGIN INK (KITS) ×2 IMPLANT
NDL HYPO 25X1 1.5 SAFETY (NEEDLE) ×2 IMPLANT
NDL SAFETY ECLIPSE 18X1.5 (NEEDLE) ×1 IMPLANT
NEEDLE HYPO 18GX1.5 SHARP (NEEDLE) ×2
NEEDLE HYPO 25X1 1.5 SAFETY (NEEDLE) ×4 IMPLANT
NS IRRIG 1000ML POUR BTL (IV SOLUTION) ×2 IMPLANT
PACK BASIN DAY SURGERY FS (CUSTOM PROCEDURE TRAY) ×2 IMPLANT
PAD ALCOHOL SWAB (MISCELLANEOUS) ×2 IMPLANT
PENCIL BUTTON HOLSTER BLD 10FT (ELECTRODE) ×2 IMPLANT
PIN SAFETY STERILE (MISCELLANEOUS) IMPLANT
SLEEVE SCD COMPRESS KNEE MED (MISCELLANEOUS) ×1 IMPLANT
SPONGE GAUZE 4X4 12PLY (GAUZE/BANDAGES/DRESSINGS) ×2 IMPLANT
SPONGE LAP 18X18 X RAY DECT (DISPOSABLE) IMPLANT
SPONGE LAP 4X18 X RAY DECT (DISPOSABLE) ×2 IMPLANT
STAPLER VISISTAT 35W (STAPLE) ×1 IMPLANT
STRIP CLOSURE SKIN 1/2X4 (GAUZE/BANDAGES/DRESSINGS) IMPLANT
SUT ETHILON 3 0 FSL (SUTURE) IMPLANT
SUT MNCRL AB 4-0 PS2 18 (SUTURE) ×3 IMPLANT
SUT SILK 2 0 SH (SUTURE) ×2 IMPLANT
SUT VIC AB 2-0 CT1 27 (SUTURE)
SUT VIC AB 2-0 CT1 TAPERPNT 27 (SUTURE) IMPLANT
SUT VIC AB 3-0 SH 27 (SUTURE)
SUT VIC AB 3-0 SH 27X BRD (SUTURE) IMPLANT
SUT VICRYL 3-0 CR8 SH (SUTURE) ×2 IMPLANT
SYR CONTROL 10ML LL (SYRINGE) ×4 IMPLANT
TOWEL OR 17X24 6PK STRL BLUE (TOWEL DISPOSABLE) ×3 IMPLANT
TOWEL OR NON WOVEN STRL DISP B (DISPOSABLE) ×2 IMPLANT
TUBE CONNECTING 20X1/4 (TUBING) ×2 IMPLANT
WATER STERILE IRR 1000ML POUR (IV SOLUTION) ×1 IMPLANT
YANKAUER SUCT BULB TIP NO VENT (SUCTIONS) ×2 IMPLANT

## 2011-11-20 NOTE — Op Note (Signed)
Patient Name:           Elizabeth Sparks   Date of Surgery:        11/20/2011  Pre op Diagnosis:      Invasive ductal carcinoma, right breast, 12:00 clock position, clinical stage T1b., N0, receptor positive, HER-2-negative, Ki-67 7%  Post op Diagnosis:    same  Procedure:                 Inject blue dye right breast, right partial mastectomy with needle localization, right axillary sentinel node biopsy  Surgeon:                     Angelia Mould. Derrell Lolling, M.D., FACS  Assistant:                        Operative Indications:   This is a 70 year old Caucasian female who underwent screening mammography  recently. Further imaging studies were performed showing an 8 mm mass in the right breast at the 12:00 position. Image guided biopsy showed that this was an invasive ductal carcinoma with breast diagnostic profile as above. MRI showed this to be a solitary finding. She was motivated towards breast conservation. I felt she was a good candidate for this. She is brought to the operating room electively.  Operative Findings:       The breast tissue looked normal. The specimen mammogram showed that the wire and a marker clip were contained within the specimen. There was only a single sentinel lymph node identified.  Procedure in Detail:          The patient underwent wire localization by Dr. Laney Pastor at the [facility]. The wire entered laterally and was directed medially and was well positioned. The patient was brought to the holding area at [facility] day surgery center. The nuclear medicine technician injected radionuclide into the right breast. The patient was then taken to the operating room. Gen. Anesthesia was induced. Following alcohol prep I injected 5 cc of blue dye into the right breast, subareolar area. This was 2 cc of methylene blue mixed with 3 cc of saline. The breast was massaged for 5 minutes. Intravenous antibiotics were given. A surgical time out was held. The right breast was  prepped and draped in a sterile fashion.  0.5% Marcaine with epinephrine was used as local infiltration anesthetic. I made a transverse curvilinear incision at the 12:00 position of the right breast, approximately 2 cm above the areolar margin. Dissection was carried down into the breast tissue around the localizing wire. The specimen was removed and marked with the 6 color margin Marker cath. Specimen mammogram was performed and showed that the lesion and the markers and the wire were within the specimen. The specimen was then sent for routine histology. The breast incision was irrigated with saline, hemostasis was achieved with electrocautery, and the deeper breast tissues were closed with several layers of 3-0 Vicryl. Prior to closing the lumpectomy cavity I placed metal clips circumferentially around the wound including the deep and superficial margins. After closure the breast tissue I closed the skin with a running subcuticular suture of 4-0 Monocryl and Dermabond.  I then turned my attention to the right axilla, where the neoprobe was used to select the incision site. A transverse incision was made at the hairline in the skin crease. Dissection was carried down through the subcutaneous tissue. The clavipectoral fascia was incised and the axilla was entered.  Using the neoprobe I found one very hot, very blue lymph node. This was removed. After I did this there was almost no radioactivity I could not find any other sentinel nodes. Hemostasis was excellent and achieved with  electrocautery. The wound was irrigated with saline. The subcutaneous tissue was closed with 3-0 Vicryl sutures and the skin closed with a running subcuticular suture for Monocryl and Dermabond. A breast binder was placed and the patient taken to recovery in stable condition.  EBL 20 cc. Complications none. Counts correct.     Angelia Mould. Derrell Lolling, M.D., FACS General and Minimally Invasive Surgery Breast and Colorectal  Surgery  11/20/2011 2:38 PM

## 2011-11-20 NOTE — Anesthesia Preprocedure Evaluation (Signed)
Anesthesia Evaluation  Patient identified by MRN, date of birth, ID band  Reviewed: Allergy & Precautions, H&P , NPO status   Airway Mallampati: II      Dental  (+) Dental Advisory Given   Pulmonary shortness of breath and with exertion, COPD COPD inhaler,  clear to auscultation        Cardiovascular hypertension, Pt. on medications Regular Normal    Neuro/Psych    GI/Hepatic Neg liver ROS, GERD-  ,  Endo/Other  Negative Endocrine ROS  Renal/GU negative Renal ROS     Musculoskeletal negative musculoskeletal ROS (+)   Abdominal   Peds  Hematology   Anesthesia Other Findings   Reproductive/Obstetrics                           Anesthesia Physical Anesthesia Plan  ASA: III  Anesthesia Plan: General   Post-op Pain Management:    Induction: Intravenous  Airway Management Planned: LMA  Additional Equipment:   Intra-op Plan:   Post-operative Plan: Extubation in OR  Informed Consent: I have reviewed the patients History and Physical, chart, labs and discussed the procedure including the risks, benefits and alternatives for the proposed anesthesia with the patient or authorized representative who has indicated his/her understanding and acceptance.   Dental advisory given  Plan Discussed with: CRNA  Anesthesia Plan Comments:         Anesthesia Quick Evaluation

## 2011-11-20 NOTE — Transfer of Care (Signed)
Immediate Anesthesia Transfer of Care Note  Patient: Elizabeth Sparks  Procedure(s) Performed:  BREAST LUMPECTOMY WITH SENTINEL LYMPH NODE BX - right partial mastectomy with neddle localization (12pm) and sential lymph node (2pm)  Patient Location: PACU  Anesthesia Type: General  Level of Consciousness: awake and alert   Airway & Oxygen Therapy: Patient Spontanous Breathing and Patient connected to face mask oxygen  Post-op Assessment: Report given to PACU RN and Post -op Vital signs reviewed and stable  Post vital signs: Reviewed and stable  Complications: No apparent anesthesia complications

## 2011-11-20 NOTE — Anesthesia Postprocedure Evaluation (Signed)
  Anesthesia Post-op Note  Patient: Elizabeth Sparks  Procedure(s) Performed:  BREAST LUMPECTOMY WITH SENTINEL LYMPH NODE BX - right partial mastectomy with neddle localization (12pm) and sential lymph node (2pm)  Patient Location: PACU  Anesthesia Type: General  Level of Consciousness: awake  Airway and Oxygen Therapy: Patient Spontanous Breathing  Post-op Pain: mild  Post-op Assessment: Post-op Vital signs reviewed  Post-op Vital Signs: stable  Complications: No apparent anesthesia complicationsdfff

## 2011-11-20 NOTE — Anesthesia Procedure Notes (Signed)
Procedure Name: LMA Insertion Performed by: Ivar Domangue Pre-anesthesia Checklist: Patient identified, Timeout performed, Emergency Drugs available, Suction available and Patient being monitored Patient Re-evaluated:Patient Re-evaluated prior to inductionOxygen Delivery Method: Circle System Utilized Preoxygenation: Pre-oxygenation with 100% oxygen Intubation Type: IV induction Ventilation: Mask ventilation without difficulty LMA: LMA with gastric port inserted LMA Size: 4.0 Number of attempts: 1 Placement Confirmation: breath sounds checked- equal and bilateral and positive ETCO2 Tube secured with: Tape Dental Injury: Teeth and Oropharynx as per pre-operative assessment      

## 2011-11-20 NOTE — Interval H&P Note (Signed)
History and Physical Interval Note:  11/20/2011 1:00 PM  She had her needle ocallization by Dr. Mayford Knife, and the wire appears to be correctly placed in the superior right breast.  Elizabeth Sparks  has presented today for surgery, with the diagnosis of right breast cancer  The goals of treatment and the various methods of treatment have been discussed with the patient and family. After consideration of risks, benefits and other options for treatment, the patient has consented to  Procedure(s): RIGHT BREAST LUMPECTOMY WITH SENTINEL LYMPH NODE BX as a surgical intervention .  The patients' history has been reviewed today, the  Patient has been  examined today, there is no change in status, she is stable for surgery.  I have reviewed the patients' chart and labs.  Questions were answered to the patient's satisfaction.     Ernestene Mention 11/20/2011 1:03 PM

## 2011-11-24 ENCOUNTER — Telehealth (INDEPENDENT_AMBULATORY_CARE_PROVIDER_SITE_OTHER): Payer: Self-pay

## 2011-11-24 NOTE — Telephone Encounter (Signed)
LMOM for pt to call for path result and her PO appt date of 01-03 arrive @ 11;30.

## 2011-12-02 ENCOUNTER — Telehealth (INDEPENDENT_AMBULATORY_CARE_PROVIDER_SITE_OTHER): Payer: Self-pay

## 2011-12-02 NOTE — Telephone Encounter (Signed)
Pt notified of path and po appt last week. I called to verify this.

## 2011-12-03 ENCOUNTER — Other Ambulatory Visit (INDEPENDENT_AMBULATORY_CARE_PROVIDER_SITE_OTHER): Payer: Self-pay | Admitting: General Surgery

## 2011-12-03 DIAGNOSIS — C50919 Malignant neoplasm of unspecified site of unspecified female breast: Secondary | ICD-10-CM

## 2011-12-17 ENCOUNTER — Encounter (INDEPENDENT_AMBULATORY_CARE_PROVIDER_SITE_OTHER): Payer: Self-pay | Admitting: General Surgery

## 2011-12-17 ENCOUNTER — Ambulatory Visit (INDEPENDENT_AMBULATORY_CARE_PROVIDER_SITE_OTHER): Payer: 59 | Admitting: General Surgery

## 2011-12-17 VITALS — BP 138/66 | HR 80 | Temp 97.1°F | Resp 20 | Ht 63.75 in | Wt 143.4 lb

## 2011-12-17 DIAGNOSIS — Z9889 Other specified postprocedural states: Secondary | ICD-10-CM

## 2011-12-17 NOTE — Progress Notes (Signed)
Subjective:     Patient ID: Elizabeth Sparks, female   DOB: 1941-02-28, 71 y.o.   MRN: [id number]  HPI This is the first postop visit for this 71 year old Caucasian female who underwent a right partial mastectomy and sentinel node biopsy on November 20, 2011. Final pathology report showed invasive ductal carcinoma,    grade II/III,    9 mm tumor,    negative margins, negative sentinel node, strongly positive receptors, HER-2-negative, Ki-67   7%. Pathologic stage T1b., N0.  She is doing well. She has some bruising. She has no arm swelling or arm numbness. She has full range of motion of her right shoulder. She's had no drainage.  Review of Systems     Objective:   Physical Exam The patient looks well. Her husband is with her.  Right breast shows some resolving ecchymoses and some thickening centrally consistent with a small hematoma. There is no drainage or infection. There has been a slight volume loss but not much. The right axillary wound looks fine. Range of motion right shoulder is 100%.    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 Recovering uneventfully in the early postop period following partial mastectomy and sentinel biopsy.    Plan:     Diet and activities discussed.  Keep appointment with Dr. Cleone Slim on January 11. I told her to anticipate that he will discuss antiestrogen therapy and adjuvant radiation therapy.  Return to see me in 4 months, and lesser or wound problems.

## 2011-12-17 NOTE — Patient Instructions (Signed)
Your  lumpectomy wound and axillary wound are healing nicely without any signs of infection. The bruising in your breast should slowly go away over the next month or 2. You may exercise. Be sure to keep her appointment with Dr. Cleone Slim on January 11. I anticipate that he will refer you to a radiation oncologist as well. I will see you back in the office in 4 months, unless there are any wound problems.

## 2012-04-21 ENCOUNTER — Encounter (INDEPENDENT_AMBULATORY_CARE_PROVIDER_SITE_OTHER): Payer: Self-pay | Admitting: General Surgery

## 2012-04-21 ENCOUNTER — Ambulatory Visit (INDEPENDENT_AMBULATORY_CARE_PROVIDER_SITE_OTHER): Payer: 59 | Admitting: General Surgery

## 2012-04-21 VITALS — BP 139/63 | HR 77 | Temp 98.6°F | Ht 63.25 in | Wt 145.6 lb

## 2012-04-21 DIAGNOSIS — C50119 Malignant neoplasm of central portion of unspecified female breast: Secondary | ICD-10-CM | POA: Insufficient documentation

## 2012-04-21 NOTE — Patient Instructions (Signed)
Your breast exam reveals no abnormality, and there is no evidence of breast cancer.  Keep your regularly scheduled appointment with Dr. Margo Common and with Dr. Theressa Millard.  Repeat bilateral mammograms in November 2013  Return to see Dr. Derrell Lolling in December 2013 for a one-year checkup.

## 2012-04-21 NOTE — Progress Notes (Signed)
Patient ID: Elizabeth Sparks, female   DOB: 05/05/1941, 71 y.o.   MRN: [id number]  Chief Complaint  Patient presents with  . Breast Cancer Long Term Follow Up    yrly br ck    HPI Elizabeth Sparks is a 71 y.o. female.  She returns for followup of her right breast cancer.  This patient underwent right partial mastectomy and sentinel node biopsy on November 20, 2011. She had grade 2 invasive carcinoma, 9 mm tumor, receptor positive, HER-2-negative, Ki67 7%, pathologic stage T1b,  N0.     Dr. Basilio Cairo supervise her postop radiation therapy.  She has no complaints her about her breast other than a little but of tenderness. She has full range of motion of her right shoulder and has no arm swelling or numbness.  She is due for mammograms in October or November.  She is followed by Blanca Friend. He has placed her on alendronate because of her osteoporosis. She does not take anti-estrogen therapy. HPI  Past Medical History  Diagnosis Date  . Arthritis   . COPD (chronic obstructive pulmonary disease)   . Hypertension   . Thyroid disease   . Wheezing   . Shortness of breath   . GERD (gastroesophageal reflux disease)   . Complication of anesthesia     has had ilius  . Cancer     right breast    Past Surgical History  Procedure Date  . Rotator cuff repair 2001    left  . Pelvic tumors 2009  . Hernia repair 2011    abd wall hernia  . Abdominal hysterectomy 1974    part hyst  . Uterine fibroid surgery 2009    ovaries out  . Mastectomy, partial 11/20/11    right breast  . Breast surgery 11/20/2011    Rt partial mastectomy    Family History  Problem Relation Age of Onset  . Heart disease Mother   . Cancer Father     prostate  . Heart disease Father   . Cancer Maternal Grandmother     liver    Social History History  Substance Use Topics  . Smoking status: Former Smoker    Quit date: 10/28/2009  . Smokeless tobacco: Not on file  . Alcohol Use: 1.2 oz/week    2 Glasses of  wine per week    Allergies  Allergen Reactions  . Codeine     REACTION: headache and nausea  . Procaine Hcl     REACTION: headaches    Current Outpatient Prescriptions  Medication Sig Dispense Refill  . alendronate (FOSAMAX) 70 MG tablet Take 70 mg by mouth every 7 (seven) days. Take with a full glass of water on an empty stomach.      Marland Kitchen amLODipine (NORVASC) 5 MG tablet Take 5 mg by mouth daily.        Marland Kitchen aspirin 81 MG tablet Take 81 mg by mouth daily.        . budesonide-formoterol (SYMBICORT) 160-4.5 MCG/ACT inhaler Inhale 2 puffs into the lungs 2 (two) times daily.        . calcium carbonate (OS-CAL) 600 MG TABS Take 600 mg by mouth daily.      . Cholecalciferol (VITAMIN D-3 PO) Take by mouth daily.        . hydrochlorothiazide (HYDRODIURIL) 25 MG tablet Take 25 mg by mouth daily.        . Multiple Vitamins-Minerals (MULTIVITAMIN WITH MINERALS) tablet Take 1 tablet by mouth daily.        Marland Kitchen  omeprazole (PRILOSEC) 20 MG capsule Take 20 mg by mouth every evening.        . simvastatin (ZOCOR) 10 MG tablet Take 10 mg by mouth at bedtime.        Marland Kitchen tiotropium (SPIRIVA) 18 MCG inhalation capsule Place 18 mcg into inhaler and inhale daily.          Review of Systems Review of Systems  Constitutional: Negative for fever, chills and unexpected weight change.  HENT: Negative for hearing loss, congestion, sore throat, trouble swallowing and voice change.   Eyes: Negative for visual disturbance.  Respiratory: Negative for cough and wheezing.   Cardiovascular: Negative for chest pain, palpitations and leg swelling.  Gastrointestinal: Negative for nausea, vomiting, abdominal pain, diarrhea, constipation, blood in stool, abdominal distention and anal bleeding.  Genitourinary: Negative for hematuria, vaginal bleeding and difficulty urinating.  Musculoskeletal: Negative for arthralgias.  Skin: Negative for rash and wound.  Neurological: Negative for seizures, syncope and headaches.    Hematological: Negative for adenopathy. Does not bruise/bleed easily.  Psychiatric/Behavioral: Negative for confusion.    Blood pressure 139/63, pulse 77, temperature 98.6 F (37 C), temperature source Temporal, height 5' 3.25" (1.607 m), weight 145 lb 9.6 oz (66.044 kg), SpO2 97.00%.  Physical Exam Physical Exam  Constitutional: She appears well-developed and well-nourished. No distress.  Neck: Normal range of motion. Neck supple. No JVD present. No tracheal deviation present. No thyromegaly present.  Cardiovascular: Normal rate, regular rhythm, normal heart sounds and intact distal pulses.   No murmur heard. Pulmonary/Chest: Effort normal and breath sounds normal. No respiratory distress. She has no wheezes. She has no rales. She exhibits no tenderness.    Musculoskeletal: Normal range of motion. She exhibits no edema and no tenderness.  Lymphadenopathy:    She has no cervical adenopathy.  Skin: Skin is warm and dry. No rash noted. She is not diaphoretic. No erythema. No pallor.  Psychiatric: She has a normal mood and affect. Her behavior is normal. Judgment and thought content normal.    Data Reviewed  Assessment    Grade 2 invasive ductal carcinoma right breast, 12:00 position, pathologic stage T1b., N0, receptor positive, HER-2-negative, Ki-67  7%.  No evidence of wound healing problems, and no evidence of recurrence 5 months postop right partial mastectomy and sentinel biopsy.  COPD  Hypertension  Hyperlipidemia.    Plan    Continue followup with Dr. Theressa Millard and Dr. Margo Common.  Repeat bilateral mammograms of November 2013.  Return to see me December 2013       Jameriah Trotti M. Derrell Lolling, M.D., [facility] General and Minimally invasive Surgery Breast and Colorectal Surgery Office:   [phone] Pager:   [phone]  04/21/2012, 10:43 AM

## 2012-05-02 ENCOUNTER — Encounter: Payer: 59 | Admitting: Hematology and Oncology

## 2012-05-02 DIAGNOSIS — J449 Chronic obstructive pulmonary disease, unspecified: Secondary | ICD-10-CM

## 2012-05-02 DIAGNOSIS — C50919 Malignant neoplasm of unspecified site of unspecified female breast: Secondary | ICD-10-CM

## 2012-05-02 DIAGNOSIS — M810 Age-related osteoporosis without current pathological fracture: Secondary | ICD-10-CM

## 2012-05-02 DIAGNOSIS — I10 Essential (primary) hypertension: Secondary | ICD-10-CM

## 2012-07-12 ENCOUNTER — Other Ambulatory Visit: Payer: Self-pay | Admitting: Radiation Oncology

## 2012-08-30 ENCOUNTER — Encounter: Payer: 59 | Admitting: Hematology and Oncology

## 2012-08-30 DIAGNOSIS — M810 Age-related osteoporosis without current pathological fracture: Secondary | ICD-10-CM

## 2012-08-30 DIAGNOSIS — I10 Essential (primary) hypertension: Secondary | ICD-10-CM

## 2012-08-30 DIAGNOSIS — J449 Chronic obstructive pulmonary disease, unspecified: Secondary | ICD-10-CM

## 2012-08-30 DIAGNOSIS — C50919 Malignant neoplasm of unspecified site of unspecified female breast: Secondary | ICD-10-CM

## 2012-08-31 ENCOUNTER — Other Ambulatory Visit: Payer: Self-pay | Admitting: Internal Medicine

## 2012-08-31 ENCOUNTER — Other Ambulatory Visit (INDEPENDENT_AMBULATORY_CARE_PROVIDER_SITE_OTHER): Payer: Self-pay | Admitting: General Surgery

## 2012-08-31 DIAGNOSIS — Z853 Personal history of malignant neoplasm of breast: Secondary | ICD-10-CM

## 2012-10-06 ENCOUNTER — Ambulatory Visit
Admission: RE | Admit: 2012-10-06 | Discharge: 2012-10-06 | Disposition: A | Discharge status: Home / Self Care | Payer: 59 | Source: Ambulatory Visit | Attending: General Surgery | Admitting: General Surgery

## 2012-10-06 DIAGNOSIS — Z853 Personal history of malignant neoplasm of breast: Secondary | ICD-10-CM

## 2012-10-07 ENCOUNTER — Encounter (INDEPENDENT_AMBULATORY_CARE_PROVIDER_SITE_OTHER): Payer: Self-pay | Admitting: General Surgery

## 2012-10-07 NOTE — Progress Notes (Signed)
Faxed signed authorization to [facility] # [phone] approving annual diagnostic mammogram. Confirmation received and sent to medical records to be scanned.

## 2012-10-24 ENCOUNTER — Ambulatory Visit (INDEPENDENT_AMBULATORY_CARE_PROVIDER_SITE_OTHER): Payer: 59 | Admitting: General Surgery

## 2012-10-24 ENCOUNTER — Encounter (INDEPENDENT_AMBULATORY_CARE_PROVIDER_SITE_OTHER): Payer: Self-pay | Admitting: General Surgery

## 2012-10-24 VITALS — BP 126/60 | HR 77 | Temp 98.5°F | Ht 63.25 in | Wt 139.2 lb

## 2012-10-24 DIAGNOSIS — C50119 Malignant neoplasm of central portion of unspecified female breast: Secondary | ICD-10-CM

## 2012-10-24 NOTE — Patient Instructions (Addendum)
Your breast exam today and your recent mammograms showed no focal abnormality. There is no evidence of breast cancer on either side.  Return to see Dr. Derrell Lolling in one year after you gets your annual mammograms.

## 2012-10-24 NOTE — Progress Notes (Signed)
Patient ID: Elizabeth Sparks, female   DOB: [birth year], 71 y.o.   MRN: [id number] History: Patient returns for long-term followup regarding her right breast cancer. On 11/20/2011  she underwent right partial mastectomy and sentinel node biopsy for invasive cancer at the 12:00 position. This was invasive ductal carcinoma, 9 mm diameter, receptor positive, HER-2 negative, Ki- 67 7%, pathologic stage TI B., N0. She had adjuvant radiation therapy supervised by Dr. Basilio Cairo. She was followed by Dr. Cleone Slim, now retired. . They decided against antiestrogen therapy. She has no specific complaints about either breast. Recent mammograms at the breast center [facility] on 10/06/2012 showed no focal abnormality, category 2. She sees Dr. Earl Gala every 6 months for management of COPD and hypertension.  Exam patient looks well. In no distress. Husband with her. Neck no adenopathy. No mass. No JVD. Lungs clear to auscultation. No wheeze. No rhonchi Heart regular rate and rhythm with occasional extrasystole. No murmur Breast right breast reveals transverse scar centrally above the areola and well-healed right axilla scar. Otherwise skin healthy no mass no axillary adenopathy.    left breast reveals scar upper inner quadrant from previous benign biopsy. A focal abnormality the palpable mass no axillary adenopathy.  Medications "reviewed.  ROS: 10 system ROS performed and negative except as described above.  Assessment: Invasive ductal carcinoma right breast, central, 12:00 position, pathologic stage TI B., N0, receptor positive, HER-2 negative. No evidence of recurrence one year following right partial mastectomy and sentinel lead biopsy COPD Hypertension Hyperlipidemia  Plan: Repeat bilateral mammograms in 1 year See me in one year for breast exam    Perrin Gens M. Derrell Lolling, M.D., [facility] General and Minimally invasive Surgery Breast and Colorectal Surgery Office:    [phone] Pager:   [phone]

## 2013-07-14 ENCOUNTER — Ambulatory Visit: Admission: RE | Admit: 2013-07-14 | Payer: 59 | Source: Ambulatory Visit | Admitting: Radiation Oncology

## 2013-07-23 ENCOUNTER — Encounter: Payer: Self-pay | Admitting: Radiation Oncology

## 2013-07-26 ENCOUNTER — Ambulatory Visit
Admission: RE | Admit: 2013-07-26 | Discharge: 2013-07-26 | Disposition: A | Discharge status: Home / Self Care | Payer: 59 | Source: Ambulatory Visit | Attending: Radiation Oncology | Admitting: Radiation Oncology

## 2013-07-26 ENCOUNTER — Encounter: Payer: Self-pay | Admitting: Radiation Oncology

## 2013-07-26 ENCOUNTER — Telehealth: Payer: Self-pay | Admitting: *Deleted

## 2013-07-26 VITALS — BP 156/53 | HR 83 | Temp 97.8°F | Resp 20 | Wt 144.3 lb

## 2013-07-26 DIAGNOSIS — C50111 Malignant neoplasm of central portion of right female breast: Secondary | ICD-10-CM

## 2013-07-26 HISTORY — DX: Long term (current) use of bisphosphonates: Z79.83

## 2013-07-26 HISTORY — DX: Personal history of irradiation: Z92.3

## 2013-07-26 HISTORY — DX: Malignant neoplasm of unspecified site of unspecified female breast: C50.919

## 2013-07-26 NOTE — Telephone Encounter (Signed)
Called patient to inform of mammogram on October 27- arrival time - 9:30 am at [facility], spoke with patient and she is aware of this test.

## 2013-07-26 NOTE — Progress Notes (Signed)
Radiation Oncology         [phone] ________________________________  Name: Elizabeth Sparks MRN: [id number]  Date: 07/26/2013  DOB: 02-01-1941  Follow-Up Visit Note  Outpatient  CC: Darnelle Bos, MD  Darnelle Bos,*  Diagnosis and Prior Radiotherapy:  T1bN0M0 right breast invasive ductal carcinoma, grade 2, ER/PR positive, HER-2/neu negative  She received 42.56 Gray in 16 fractions completed 02/10/2012 to her right breast in [place], [place]  Narrative:  The patient returns today for routine follow-up.      She is doing well with minimal complaints. She is not on any antiestrogen therapy. She had a reaction to Evista and since then has been on alendronate only. She is due for a mammogram in October. She denies any palpable lumps or bumps in her breasts.               ALLERGIES:  is allergic to codeine and procaine hcl.  Meds: Current Outpatient Prescriptions  Medication Sig Dispense Refill  . alendronate (FOSAMAX) 70 MG tablet Take 70 mg by mouth every 7 (seven) days. Take with a full glass of water on an empty stomach.      Marland Kitchen aspirin 81 MG tablet Take 81 mg by mouth daily.        . budesonide-formoterol (SYMBICORT) 160-4.5 MCG/ACT inhaler Inhale 2 puffs into the lungs 2 (two) times daily.        . calcium carbonate (OS-CAL) 600 MG TABS Take 600 mg by mouth daily.      . Cholecalciferol (VITAMIN D-3 PO) Take by mouth daily.        . hydrochlorothiazide (HYDRODIURIL) 25 MG tablet Take 25 mg by mouth daily.        Marland Kitchen losartan (COZAAR) 100 MG tablet Take 100 mg by mouth daily.      . Multiple Vitamins-Minerals (MULTIVITAMIN WITH MINERALS) tablet Take 1 tablet by mouth daily.        Marland Kitchen omeprazole (PRILOSEC) 20 MG capsule Take 20 mg by mouth every evening.        . simvastatin (ZOCOR) 10 MG tablet Take 10 mg by mouth at bedtime.        Marland Kitchen tiotropium (SPIRIVA) 18 MCG inhalation capsule Place 18 mcg into inhaler and inhale daily.         No current facility-administered  medications for this encounter.    Physical Findings: The patient is in no acute distress. Patient is alert and oriented.  weight is 144 lb 4.8 oz (65.454 kg). Her oral temperature is 97.8 F (36.6 C). Her blood pressure is 156/53 and her pulse is 83. Her respiration is 20. .  Breast exam demonstrates no suspicious lesions palpated in either breast. No axillary adenopathy palpated bilaterally. Her neck demonstrates no palpable cervical or supraclavicular lymphadenopathy..  Lab Findings: Lab Results  Component Value Date   WBC 6.0 11/19/2011   HGB 14.2 11/19/2011   HCT 41.7 11/19/2011   MCV 83.6 11/19/2011   PLT 244 11/19/2011    Radiographic Findings: No results found.  Impression/Plan: Doing well with no evidence of disease. I offered to see her back in one year and she is happy to do this. I will also put in an order for her mammogram in October. She knows to call if she has any issues in the interim.  I spent 15 minutes face to face with the patient and more than 50% of that time was spent in counseling and/or coordination of care. _____________________________________   Lonie Peak,  MD

## 2013-07-26 NOTE — Progress Notes (Signed)
Pt denies pain, fatigue, loss of appetite. She will have mammogram Oct 2014. Pt taking Fosamax.

## 2013-10-09 ENCOUNTER — Ambulatory Visit
Admission: RE | Admit: 2013-10-09 | Discharge: 2013-10-09 | Disposition: A | Discharge status: Home / Self Care | Payer: Medicare Other | Source: Ambulatory Visit | Attending: Radiation Oncology | Admitting: Radiation Oncology

## 2013-10-09 DIAGNOSIS — C50111 Malignant neoplasm of central portion of right female breast: Secondary | ICD-10-CM

## 2013-12-12 ENCOUNTER — Ambulatory Visit (INDEPENDENT_AMBULATORY_CARE_PROVIDER_SITE_OTHER): Payer: 59 | Admitting: General Surgery

## 2013-12-15 ENCOUNTER — Encounter (INDEPENDENT_AMBULATORY_CARE_PROVIDER_SITE_OTHER): Payer: Self-pay | Admitting: General Surgery

## 2013-12-15 ENCOUNTER — Ambulatory Visit (INDEPENDENT_AMBULATORY_CARE_PROVIDER_SITE_OTHER): Payer: Medicare Other | Admitting: General Surgery

## 2013-12-15 ENCOUNTER — Encounter (INDEPENDENT_AMBULATORY_CARE_PROVIDER_SITE_OTHER): Payer: Self-pay

## 2013-12-15 VITALS — BP 148/76 | HR 88 | Temp 98.5°F | Resp 14 | Ht 63.25 in | Wt 140.0 lb

## 2013-12-15 DIAGNOSIS — C50119 Malignant neoplasm of central portion of unspecified female breast: Secondary | ICD-10-CM

## 2013-12-15 DIAGNOSIS — C50111 Malignant neoplasm of central portion of right female breast: Secondary | ICD-10-CM

## 2013-12-15 NOTE — Progress Notes (Signed)
Patient ID: Elizabeth Sparks, female   DOB: March 01, 1941, 73 y.o.   MRN: [id number]  History: Patient returns for long-term followup regarding her right breast cancer.  On 11/20/2011 she underwent right partial mastectomy and sentinel node biopsy for invasive cancer at the 12:00 position. This was invasive ductal carcinoma, 9 mm diameter, receptor positive, HER-2 negative, Ki- 67 7%, pathologic stage TI B., N0. She had adjuvant radiation therapy supervised by Dr. Isidore Moos. She was followed by Dr. Sonny Dandy, now retired. . They decided against antiestrogen therapy.  She has no specific complaints about either breast. Recent mammograms at the breast center [facility] on 10/10/2013  showed no focal abnormality, category 2.  She sees Dr. Maxwell Caul every 6 months for management of COPD and hypertension.  She sees Dr. Peyton Bottoms annually in August.  Past history, family history, social history, and review of systems are documented in old chart, reviewed, unchanged and noncontributory except as described above.  Exam patient looks well. In no distress. Husband with her.  Neck no adenopathy. No mass. No JVD.  Lungs clear to auscultation. No wheeze. No rhonchi  Heart regular rate and rhythm with occasional extrasystole. No murmur  Breast right breast reveals transverse scar centrally above the areola and well-healed right axilla scar. Otherwise skin healthy no mass no axillary adenopathy. left breast reveals scar upper inner quadrant from previous benign biopsy. No focal abnormality, no palpable mass, no axillary adenopathy.  .  Assessment: Invasive ductal carcinoma right breast, central, 12:00 position, pathologic stage TI B., N0, receptor positive, HER-2 negative.  No evidence of recurrence two  Years  following right partial mastectomy and sentinel node biopsy  COPD  Hypertension  Hyperlipidemia   Plan: Repeat bilateral mammograms in 1 year  See me in one year for breast exam     Fernando Stoiber M. Dalbert Batman, M.D.,  [facility]  General and Minimally invasive Surgery  Breast and Colorectal Surgery  Office: [phone]

## 2013-12-15 NOTE — Patient Instructions (Signed)
Your breast exam and examination of the lymph nodes is normal. Your recent mammograms are also normal. There is no evidence of breast cancer.  Keep your regular appointment with Dr. Peyton Bottoms  Repeat bilateral mammograms in October of 2015  Return to see Dr. Dalbert Batman in one year, after mammograms are done.

## 2014-02-12 ENCOUNTER — Telehealth (INDEPENDENT_AMBULATORY_CARE_PROVIDER_SITE_OTHER): Payer: Self-pay

## 2014-02-12 NOTE — Telephone Encounter (Signed)
Pt called stating she is in [place] and will not return until April. Pt states she had area of right breast that appeared to have a sunburn like rash develop. Pt states she went to see PA at urgent care. No fever. No swelling. No mass. PA treated it topically. Area is improving. Pt advised by PA if this does not resolve she will order U/S just to be sure. Pt advised if area resolves and no further work up is required by PA, to call our office when she returns to [facility] for OV and quick exam by Dr Dalbert Batman. Pt advised this may just be inflammation changes from past radiation treatment but she needs to be followed closely to be sure. Pt states she understands and will call once she knows her schedule to make appt with Dr Dalbert Batman.

## 2014-02-12 NOTE — Telephone Encounter (Signed)
Agree 

## 2014-04-19 ENCOUNTER — Encounter (INDEPENDENT_AMBULATORY_CARE_PROVIDER_SITE_OTHER): Payer: Self-pay | Admitting: General Surgery

## 2014-04-19 ENCOUNTER — Ambulatory Visit (INDEPENDENT_AMBULATORY_CARE_PROVIDER_SITE_OTHER): Payer: Medicare Other | Admitting: General Surgery

## 2014-04-19 VITALS — BP 160/80 | HR 80 | Temp 97.6°F | Resp 16 | Ht 63.0 in | Wt 147.0 lb

## 2014-04-19 DIAGNOSIS — C50119 Malignant neoplasm of central portion of unspecified female breast: Secondary | ICD-10-CM

## 2014-04-19 NOTE — Patient Instructions (Signed)
Examination of your breasts and the regional lymph nodes today is normal. Your mammograms dated 10/09/2013 are also normal. There is no evidence of cancer.  It sounds like you may have had a low-grade cellulitis of the right breast when you were in [place] in March. That has now completely resolved. No further intervention is required, but it may or may not recur in the future  Be sure to get mammograms in  November of 2015  Return to see Dr. Dalbert Batman in one year for breast check

## 2014-04-19 NOTE — Progress Notes (Signed)
Patient ID: Elizabeth Sparks, female   DOB: 1941/02/10, 73 y.o.   MRN: [id number] History:  Patient returns for long-term followup regarding her right breast cancer.  On 11/20/2011 she underwent right partial mastectomy and sentinel node biopsy for invasive cancer at the 12:00 position. This was invasive ductal carcinoma, 9 mm diameter, receptor positive, HER-2 negative, Ki- 67 7%, pathologic stage TI B., N0. She had adjuvant radiation therapy supervised by Dr. Isidore Moos. She was followed by Dr. Sonny Dandy, now retired. . They decided against antiestrogen therapy.  When she was in [place] on February 28 she developed redness and pain in her right breast. She was placed on doxycycline and this has now resolved. Sounds like she may have had a low-grade cellulitis or mastitis.  Recent mammograms at the breast center [facility] on 09/2013  showed no focal abnormality, category 2.  She sees Dr. Maxwell Caul every 6 months for management of COPD and hypertension.  She has seen Dr. Marcia Brash in the past. She sees Dr. Peyton Bottoms annually in August.   Past history, family history, social history, and review of systems are documented in old chart, reviewed, unchanged and noncontributory except as described above.   Exam  patient looks well. In no distress. Husband with her.  Neck no adenopathy. No mass. No JVD.  Lungs clear to auscultation. No wheeze. No rhonchi  Heart regular rate and rhythm with occasional extrasystole. No murmur  Breast right breast reveals transverse scar centrally above the areola and well-healed right axilla scar. Otherwise skin healthy no mass no axillary adenopathy. left breast reveals scar upper inner quadrant from previous benign biopsy. No focal abnormality, no palpable mass, no axillary adenopathy.  .  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  No evidence of recurrence 3 Years following  right partial mastectomy and sentinel node biopsy  Possible bacterial mastitis right breast, there were 22,015, while in [place]. Resolved COPD  Hypertension  Hyperlipidemia   Plan:  Repeat bilateral mammograms in 1 year  See Dr. Isidore Moos in July or August of this year, as usual See me in one year for breast exam    Kareen Jefferys M. Dalbert Batman, M.D., [facility]  General and Minimally invasive Surgery  Breast and Colorectal Surgery  Office: [phone]

## 2014-05-28 ENCOUNTER — Ambulatory Visit (INDEPENDENT_AMBULATORY_CARE_PROVIDER_SITE_OTHER)
Admission: RE | Admit: 2014-05-28 | Discharge: 2014-05-28 | Disposition: A | Discharge status: Home / Self Care | Payer: Medicare Other | Source: Ambulatory Visit | Attending: Pulmonary Disease | Admitting: Pulmonary Disease

## 2014-05-28 ENCOUNTER — Encounter: Payer: Self-pay | Admitting: Pulmonary Disease

## 2014-05-28 ENCOUNTER — Ambulatory Visit (INDEPENDENT_AMBULATORY_CARE_PROVIDER_SITE_OTHER): Payer: Medicare Other | Admitting: Pulmonary Disease

## 2014-05-28 VITALS — BP 150/80 | HR 77 | Temp 97.7°F | Ht 63.0 in | Wt 149.0 lb

## 2014-05-28 DIAGNOSIS — J439 Emphysema, unspecified: Secondary | ICD-10-CM

## 2014-05-28 DIAGNOSIS — J438 Other emphysema: Secondary | ICD-10-CM

## 2014-05-28 DIAGNOSIS — J479 Bronchiectasis, uncomplicated: Secondary | ICD-10-CM

## 2014-05-28 DIAGNOSIS — R059 Cough, unspecified: Secondary | ICD-10-CM

## 2014-05-28 DIAGNOSIS — R05 Cough: Secondary | ICD-10-CM

## 2014-05-28 DIAGNOSIS — J449 Chronic obstructive pulmonary disease, unspecified: Secondary | ICD-10-CM | POA: Insufficient documentation

## 2014-05-28 DIAGNOSIS — A310 Pulmonary mycobacterial infection: Secondary | ICD-10-CM

## 2014-05-28 MED ORDER — TRAMADOL HCL 50 MG PO TABS: 50.0000 mg | ORAL_TABLET | Freq: Four times a day (QID) | ORAL | Status: DC | PRN

## 2014-05-28 NOTE — Progress Notes (Signed)
   Subjective:    Patient ID: Elizabeth Sparks, female    DOB: Aug 21, 1941, 73 y.o.   MRN: [id number]  HPI The patient is a 73 year old female who I've been asked to see for chronic cough. 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 She also has a history of mild to moderate COPD, secondary to her prior smoking history and possibly from her bronchiectasis. She also has had a chronic upper airway cough that bothers her on an off over the years, and her history is suggestive of the irritable larynx syndrome. I last saw her over 3 years ago, and try to treat cough with behavioral therapy as well as cough suppression. She has done okay until November of last year when she developed a sinus infection and was also told that she had a COPD flare. Her symptoms improved with treatment, but she was left with a chronic dry hacking cough thereafter. It improved during January and February, but has come back in March as bad as ever. She is describing frequent cough paroxysms, with one occasion of regurgitation. She has a fullness and tickle in her throat which leads to throat clearing, but she produces no mucus with throat clearing or coughing. It is very dry in nature, and she feels is coming from her throat area. She has tried hard candy, but her cough has not improved. It is clearly worse with excessive voice use, and when eating grainy foods.  She denies any postnasal drip or sinus symptoms at this time, and has only occasional reflux symptoms. She has not had a recent chest x-ray.   Review of Systems  Constitutional: Negative for fever and unexpected weight change.  HENT: Negative for congestion, dental problem, ear pain, nosebleeds, postnasal drip, rhinorrhea, sinus pressure, sneezing, sore throat and trouble swallowing.   Eyes: Negative for redness and itching.  Respiratory: Positive for cough and shortness of breath. Negative for chest  tightness and wheezing.   Cardiovascular: Positive for chest pain. Negative for palpitations and leg swelling.  Gastrointestinal: Negative for nausea and vomiting.  Genitourinary: Negative for dysuria.  Musculoskeletal: Negative for joint swelling.  Skin: Negative for rash.  Neurological: Negative for headaches.  Hematological: Does not bruise/bleed easily.  Psychiatric/Behavioral: Negative for dysphoric mood. The patient is not nervous/anxious.        Objective:   Physical Exam Constitutional:  Well developed, no acute distress  HENT:  Nares patent without discharge  Oropharynx without exudate, palate and uvula are normal  Eyes:  Perrla, eomi, no scleral icterus  Neck:  No JVD, no TMG  Cardiovascular:  Normal rate, regular rhythm, no rubs or gallops.  No murmurs        Intact distal pulses  Pulmonary :  Normal breath sounds, no stridor or respiratory distress   No rhonchi, or wheezing, a few scattered crackles.  Abdominal:  Soft, nondistended, bowel sounds present.  No tenderness noted.   Musculoskeletal:  No lower extremity edema noted.  Lymph Nodes:  No cervical lymphadenopathy noted  Skin:  No cyanosis noted  Neurologic:  Alert, appropriate, moves all 4 extremities without obvious deficit.         Assessment & Plan:

## 2014-05-28 NOTE — Assessment & Plan Note (Signed)
The patient has a history of bronchiectasis, but this does not appear to be a flareup at this time.

## 2014-05-28 NOTE — Patient Instructions (Signed)
Will check a chest xray today and will call with results. Continue with hard candy, no throat clearing, limit voice use Increase nexium to 40mg  each day for next few weeks. Will try tramadol 50mg  one every 6hrs if needed for cough.  We need to keep suppressed as much as possible. followup with me again in 2 weeks.

## 2014-05-28 NOTE — Assessment & Plan Note (Signed)
The patient has a chronic cough that sounds upper airway in origin rather than lower. 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 She has not had a recent chest x-ray, and will do that today to make sure this has not worsened. However, her cough today is more upper airway, and her history is classic for the irritable larynx syndrome. She also has a cyclical component to her cough but has led to vomiting. I've asked her to continue with behavioral therapies that we have discussed in the past, and will work on improved cough suppression with tramadol. If she does not have a response to this, will consider gabapentin.

## 2014-05-28 NOTE — Assessment & Plan Note (Signed)
The patient is on an aggressive bronchodilator regimen, and feels that her dyspnea on exertion is at baseline. I do not think her cough is related to her COPD.

## 2014-05-28 NOTE — Assessment & Plan Note (Signed)
Certainly worsening MAC can cause a chronic cough, but it is typically associated with mucus production. She has not had a recent chest x-ray, and we'll therefore check one today.

## 2014-06-10 ENCOUNTER — Encounter: Payer: Self-pay | Admitting: Cardiology

## 2014-06-10 ENCOUNTER — Emergency Department (HOSPITAL_COMMUNITY): Payer: Medicare Other

## 2014-06-10 ENCOUNTER — Encounter (HOSPITAL_COMMUNITY): Payer: Self-pay | Admitting: Emergency Medicine

## 2014-06-10 ENCOUNTER — Emergency Department (HOSPITAL_COMMUNITY)
Admission: EM | Admit: 2014-06-10 | Discharge: 2014-06-10 | Disposition: A | Discharge status: Home / Self Care | Payer: Medicare Other | Attending: Emergency Medicine | Admitting: Emergency Medicine

## 2014-06-10 DIAGNOSIS — Z79899 Other long term (current) drug therapy: Secondary | ICD-10-CM | POA: Insufficient documentation

## 2014-06-10 DIAGNOSIS — I4892 Unspecified atrial flutter: Secondary | ICD-10-CM | POA: Insufficient documentation

## 2014-06-10 DIAGNOSIS — M129 Arthropathy, unspecified: Secondary | ICD-10-CM | POA: Insufficient documentation

## 2014-06-10 DIAGNOSIS — I10 Essential (primary) hypertension: Secondary | ICD-10-CM | POA: Insufficient documentation

## 2014-06-10 DIAGNOSIS — Z87891 Personal history of nicotine dependence: Secondary | ICD-10-CM | POA: Insufficient documentation

## 2014-06-10 DIAGNOSIS — Z7982 Long term (current) use of aspirin: Secondary | ICD-10-CM | POA: Insufficient documentation

## 2014-06-10 DIAGNOSIS — E079 Disorder of thyroid, unspecified: Secondary | ICD-10-CM | POA: Insufficient documentation

## 2014-06-10 DIAGNOSIS — Z853 Personal history of malignant neoplasm of breast: Secondary | ICD-10-CM | POA: Insufficient documentation

## 2014-06-10 DIAGNOSIS — J441 Chronic obstructive pulmonary disease with (acute) exacerbation: Secondary | ICD-10-CM | POA: Insufficient documentation

## 2014-06-10 DIAGNOSIS — R42 Dizziness and giddiness: Secondary | ICD-10-CM | POA: Insufficient documentation

## 2014-06-10 DIAGNOSIS — K219 Gastro-esophageal reflux disease without esophagitis: Secondary | ICD-10-CM | POA: Insufficient documentation

## 2014-06-10 LAB — BASIC METABOLIC PANEL
BUN: 16 mg/dL (ref 6–23)
CALCIUM: 9.7 mg/dL (ref 8.4–10.5)
CO2: 27 meq/L (ref 19–32)
Chloride: 96 mEq/L (ref 96–112)
Creatinine, Ser: 0.61 mg/dL (ref 0.50–1.10)
GFR calc Af Amer: >90 mL/min (ref >90)
GFR calc non Af Amer: 88 mL/min — ABNORMAL LOW (ref >90)
Glucose, Bld: 112 mg/dL — ABNORMAL HIGH (ref 70–99)
POTASSIUM: 3.8 meq/L (ref 3.7–5.3)
SODIUM: 137 meq/L (ref 137–147)

## 2014-06-10 LAB — CBC WITH DIFFERENTIAL/PLATELET
Basophils Absolute: 0 10*3/uL (ref 0.0–0.1)
Basophils Relative: 0 % (ref 0–1)
EOS PCT: 1 % (ref 0–5)
Eosinophils Absolute: 0.1 10*3/uL (ref 0.0–0.7)
HCT: 39 % (ref 36.0–46.0)
HEMOGLOBIN: 13.2 g/dL (ref 12.0–15.0)
LYMPHS ABS: 1.1 10*3/uL (ref 0.7–4.0)
Lymphocytes Relative: 14 % (ref 12–46)
MCH: 28.4 pg (ref 26.0–34.0)
MCHC: 33.8 g/dL (ref 30.0–36.0)
MCV: 83.9 fL (ref 78.0–100.0)
Monocytes Absolute: 0.6 10*3/uL (ref 0.1–1.0)
Monocytes Relative: 8 % (ref 3–12)
Neutro Abs: 5.7 10*3/uL (ref 1.7–7.7)
Neutrophils Relative %: 77 % (ref 43–77)
PLATELETS: 237 10*3/uL (ref 150–400)
RBC: 4.65 MIL/uL (ref 3.87–5.11)
RDW: 12.8 % (ref 11.5–15.5)
WBC: 7.5 10*3/uL (ref 4.0–10.5)

## 2014-06-10 LAB — TSH: TSH: 1.6 u[IU]/mL (ref 0.350–4.500)

## 2014-06-10 LAB — URINE MICROSCOPIC-ADD ON

## 2014-06-10 LAB — URINALYSIS, ROUTINE W REFLEX MICROSCOPIC
BILIRUBIN URINE: NEGATIVE
GLUCOSE, UA: NEGATIVE mg/dL
Ketones, ur: 15 mg/dL — AB
Leukocytes, UA: NEGATIVE
Nitrite: NEGATIVE
Protein, ur: NEGATIVE mg/dL
SPECIFIC GRAVITY, URINE: 1.01 (ref 1.005–1.030)
Urobilinogen, UA: 0.2 mg/dL (ref 0.0–1.0)
pH: 6.5 (ref 5.0–8.0)

## 2014-06-10 LAB — TROPONIN I

## 2014-06-10 MED ORDER — DILTIAZEM HCL 50 MG/10ML IV SOLN
20.0000 mg | Freq: Once | INTRAVENOUS | Status: AC
  Administered 2014-06-10: 20 mg via INTRAVENOUS

## 2014-06-10 MED ORDER — SODIUM CHLORIDE 0.9 % IV BOLUS (SEPSIS)
1000.0000 mL | Freq: Once | INTRAVENOUS | Status: AC
  Administered 2014-06-10: 1000 mL via INTRAVENOUS

## 2014-06-10 MED ORDER — SODIUM CHLORIDE 0.9 % IV SOLN: Freq: Once | INTRAVENOUS | Status: DC

## 2014-06-10 MED ORDER — DILTIAZEM HCL ER COATED BEADS 120 MG PO CP24: 120.0000 mg | ORAL_CAPSULE | Freq: Every day | ORAL | Status: DC

## 2014-06-10 MED ORDER — DILTIAZEM HCL 25 MG/5ML IV SOLN
INTRAVENOUS | Status: AC
  Administered 2014-06-10: 20 mg via INTRAVENOUS
  Filled 2014-06-10: qty 5

## 2014-06-10 NOTE — ED Notes (Signed)
Pt c/o pain to inner aspect of left elbow area where blood draw was performed, pt has blood pressure cuff noted to left upper arm as well, pt has small amount of bruising noted with swelling to left ac area, blood pressure cuff removed, iv fluids stopped at present time, update given on plan of care,

## 2014-06-10 NOTE — ED Provider Notes (Signed)
CSN: [id number]     Arrival date & time 06/10/14  1034 History  This chart was scribed for Tanna Furry, MD by Elby Beck, ED Scribe. This patient was seen in room APA01/APA01 and the patient's care was started at 10:36 AM.   Chief Complaint  Patient presents with  . Fatigue    The history is provided by the patient. No language interpreter was used.   HPI Comments: Elizabeth Sparks is a 73 y.o. female with a history of breast CA and HTN who presents to the Emergency Department complaining of an episode of "heart racing" palpitations that occurred this morning. She reports an associated symptom of SOB and chest heaviness earlier today which was worse than her baseline SOB due to her history of COPD. She reports that her symptoms have partially resolved and that her mouth feels dry currently. She denies any associated chest pain or nausea. She states that she has a history of cardiac catheterization (due to a 40% blockage), but no other cardiac history. She reports that she believes she has been overexerting herself for the past week- mainly canning vegetables- and that she has become lightheaded on 1-2 occasions. She also reports that she lost her balance and fell 5 days ago. She reports hitting her head but denies LOC. She denies any dizziness preceding the fall. She states that she has areas of swelling to the top left area of her head and a bruise to her left lower arm after the fall. She further states that she had episodes of diarrhea 2 days ago, which resolved after taking Pepto Bismol.     Past Medical History  Diagnosis Date  . Arthritis   . COPD (chronic obstructive pulmonary disease)   . Hypertension   . Thyroid disease   . Wheezing   . Shortness of breath   . GERD (gastroesophageal reflux disease)   . Complication of anesthesia     has had ilius  . Cancer     right breast  . S/P radiation therapy comp. 02/10/12    42.5 Gy / 16 Fractions to Right Breast at [facility]  . History of ongoing treatment with alendronate (Fosamax)   . Breast cancer    Past Surgical History  Procedure Laterality Date  . Rotator cuff repair  2001    left  . Pelvic tumors  2009  . Hernia repair  2011    abd wall hernia  . Abdominal hysterectomy  1974    part hyst  . Uterine fibroid surgery  2009    ovaries out  . Mastectomy, partial  11/20/11    right breast  . Breast surgery  11/20/2011    Rt partial mastectomy   Family History  Problem Relation Age of Onset  . Heart disease Mother   . Cancer Father     prostate  . Heart disease Father   . Cancer Maternal Grandmother     liver   History  Substance Use Topics  . Smoking status: Former Smoker -- 0.50 packs/day for 45 years    Types: Cigarettes    Quit date: 10/28/2009  . Smokeless tobacco: Not on file  . Alcohol Use: 1.2 oz/week    2 Glasses of wine per week   OB History   Grav Para Term Preterm Abortions TAB SAB Ect Mult Living                 Review of Systems  Constitutional: Negative for fever, chills,  diaphoresis, appetite change and fatigue.  HENT: Negative for mouth sores, sore throat and trouble swallowing.   Eyes: Negative for visual disturbance.  Respiratory: Positive for shortness of breath (at baseline). Negative for cough, chest tightness and wheezing.   Cardiovascular: Positive for palpitations. Negative for chest pain.  Gastrointestinal: Positive for diarrhea (resolved). Negative for nausea, vomiting, abdominal pain and abdominal distention.  Endocrine: Negative for polydipsia, polyphagia and polyuria.  Genitourinary: Negative for dysuria, frequency and hematuria.  Musculoskeletal: Negative for gait problem.  Skin: Negative for color change, pallor and rash.       Bruise to left lower arm  Neurological: Positive for light-headedness. Negative for dizziness, syncope and headaches.  Hematological: Does not bruise/bleed easily.  Psychiatric/Behavioral: Negative for behavioral  problems and confusion.    Allergies  Codeine and Procaine hcl  Home Medications   Prior to Admission medications   Medication Sig Start Date End Date Taking? Authorizing Provider  alendronate (FOSAMAX) 70 MG tablet Take 70 mg by mouth every 7 (seven) days. Take with a full glass of water on an empty stomach.    Historical Provider, MD  aspirin 81 MG tablet Take 81 mg by mouth daily.      Historical Provider, MD  budesonide-formoterol (SYMBICORT) 160-4.5 MCG/ACT inhaler Inhale 2 puffs into the lungs 2 (two) times daily.      Historical Provider, MD  calcium carbonate (OS-CAL) 600 MG TABS Take 600 mg by mouth daily.    Historical Provider, MD  Cholecalciferol (VITAMIN D-3 PO) Take by mouth daily.      Historical Provider, MD  diltiazem (CARDIZEM CD) 120 MG 24 hr capsule Take 1 capsule (120 mg total) by mouth daily. 06/10/14   Tanna Furry, MD  esomeprazole (NEXIUM) 20 MG capsule Take 20 mg by mouth daily at 12 noon.    Historical Provider, MD  hydrochlorothiazide (HYDRODIURIL) 25 MG tablet Take 25 mg by mouth daily.      Historical Provider, MD  KLOR-CON 10 10 MEQ tablet Take 10 mEq by mouth daily.  03/12/14   Historical Provider, MD  losartan (COZAAR) 100 MG tablet Take 100 mg by mouth daily.    Historical Provider, MD  simvastatin (ZOCOR) 10 MG tablet Take 10 mg by mouth at bedtime.      Historical Provider, MD  tiotropium (SPIRIVA) 18 MCG inhalation capsule Place 18 mcg into inhaler and inhale daily.      Historical Provider, MD  traMADol (ULTRAM) 50 MG tablet Take 1 tablet (50 mg total) by mouth every 6 (six) hours as needed. 05/28/14   Kathee Delton, MD   Triage Vitals: BP 173/69  Pulse 83  Temp(Src) 98.1 F (36.7 C) (Oral)  Resp 20  Ht 5\' 3"  (1.6 m)  Wt 149 lb (67.586 kg)  BMI 26.40 kg/m2  SpO2 100%  Physical Exam  Nursing note and vitals reviewed. Constitutional: She is oriented to person, place, and time. She appears well-developed and well-nourished. No distress.  HENT:   Head: Normocephalic.  Eyes: Conjunctivae are normal. Pupils are equal, round, and reactive to light. No scleral icterus.  Neck: Normal range of motion. Neck supple. No thyromegaly present.  Cardiovascular: Normal rate and regular rhythm.  Exam reveals no gallop and no friction rub.   No murmur heard. Initially, sinus rhythm, regular at 88. Accelerates to 120, sinus tach with PVCs.  Pulmonary/Chest: Effort normal and breath sounds normal. No respiratory distress. She has no wheezes. She has no rales.  Abdominal: Soft. Bowel sounds are  normal. She exhibits no distension. There is no tenderness. There is no rebound.  Musculoskeletal: Normal range of motion.  Neurological: She is alert and oriented to person, place, and time.  Skin: Skin is warm and dry. No rash noted.  Psychiatric: She has a normal mood and affect. Her behavior is normal.    ED Course  Procedures (including critical care time)  DIAGNOSTIC STUDIES: Oxygen Saturation is 100% on RA, normal by my interpretation.    COORDINATION OF CARE: 10:43 AM- Discussed plan to obtain a CXR and diagnostic lab work. Will also order IV fluids. Pt advised of plan for treatment and pt agrees.  Labs Review Labs Reviewed  BASIC METABOLIC PANEL - Abnormal; Notable for the following:    Glucose, Bld 112 (*)    GFR calc non Af Amer 88 (*)    All other components within normal limits  URINALYSIS, ROUTINE W REFLEX MICROSCOPIC - Abnormal; Notable for the following:    Hgb urine dipstick MODERATE (*)    Ketones, ur 15 (*)    All other components within normal limits  CBC WITH DIFFERENTIAL  TROPONIN I  URINE MICROSCOPIC-ADD ON  TSH    Imaging Review Dg Chest 2 View  06/10/2014   CLINICAL DATA:  Cough  EXAM: CHEST  2 VIEW  COMPARISON:  05/28/2014  FINDINGS: Moderate cardiomegaly. Hyperaeration. Linear and patchy densities in the inferior right upper lobe are increased. New 1 x 2 cm nodular density projects over the anterior hilum on the  lateral view only. Hyperaeration. Stable haziness at the lung bases likely related to epicardial fat. No pneumothorax. Intact thoracic spine.  IMPRESSION: Increasing densities in the right upper lobe favored to represent volume loss.  New nodular density seen only on the lateral view. Pulmonary nodule is not excluded. CT is recommended.   Electronically Signed   By: Maryclare Bean M.D.   On: 06/10/2014 12:08   Ct Head Wo Contrast  06/10/2014   CLINICAL DATA:  Headache  EXAM: CT HEAD WITHOUT CONTRAST  TECHNIQUE: Contiguous axial images were obtained from the base of the skull through the vertex without intravenous contrast.  COMPARISON:  02/10/2010  FINDINGS: Stable small calcified left temporal meningioma. Global atrophy. Chronic ischemic changes in the periventricular white matter. No mass effect, midline shift, or acute intracranial hemorrhage.  Several lytic bone lesions in the cranium are noted. These are nonspecific. Mastoid air cells are clear. Mucous per is present in the posterior right ethmoid air cell.  IMPRESSION: No acute intracranial pathology.  Several lytic bone lesions.  Bone scan may be helpful.   Electronically Signed   By: Maryclare Bean M.D.   On: 06/10/2014 12:06     EKG Interpretation None      MDM   Final diagnoses:  Paroxysmal atrial flutter    Patient had an episode of paroxysmal atrial flutter, with 21 conduction. This resolved spontaneously. It did reproduce her symptoms. Given IV Cardizem, 20 mg. She had no recurrence. CT scan of her head showed bone lesions. Discussed with her that she will need bone scan to ensure that these do not suggest myeloma. Chest x-ray shows a right middle lobe pulmonary nodule. She has been evaluated for this in the past and has a previous diagnosis of mycobacterium. She follows with pulmonary. Has trace microscopic hematuria. Has had this in the past as well. Have asked her to discuss this with her primary care physician followup. Her CHA2DS2VASc score  is 3. I discussed her paroxysmal atrial  flutter with Dr. Claiborne Billings. He recommends follow up with their office, Cardizem CD, and 325 mg aspirin daily. She has an appointment with her physician tomorrow.   I personally performed the services described in this documentation, which was scribed in my presence. The recorded information has been reviewed and is accurate. }    Tanna Furry, MD 06/10/14 1317

## 2014-06-10 NOTE — ED Notes (Signed)
While Dr Jeneen Rinks in room with pt, pt's hr increased to 120's, last for a few seconds, then decreased back to mid 80's, per Dr Jeneen Rinks ?a-flutter, rhythm strip printed off given to Dr Jeneen Rinks,

## 2014-06-10 NOTE — Discharge Instructions (Signed)
Your heart had an irregular fast rhythm today called atrial flutter. Please call [place] [facility] for a followup appointment. Prescription for Cardizem to control your heart rate. Take a 325 mg aspirin daily. The CT scan of your head showed some areas that will need further evaluation, possible bone scan, to rule out myeloma. Please talk to your doctor about this at your next appointment. Your thyroid function test results will be available in 24 hours, and prior to your physician appointment tomorrow.  Atrial Flutter Atrial flutter is a heart rhythm that can cause the heart to beat very fast (tachycardia). It originates in the upper chambers of the heart (atria). In atrial flutter, the top chambers of the heart (atria) often beat much faster than the bottom chambers of the heart (ventricles). Atrial flutter has a regular "saw toothed" appearance in an EKG readout. An EKG is a test that records the electrical activity of the heart. Atrial flutter can cause the heart to beat up to 150 beats per minute (BPM). Atrial flutter can either be short lived (paroxysmal) or permanent.  CAUSES  Causes of atrial flutter can be many. Some of these include:  Heart related issues:  Heart attack (myocardial infarction).  Heart failure.  Heart valve problems.  Poorly controlled high blood pressure (hypertension).  Afteropen heart surgery.  Lung related issues:  A blood clot in the lungs (pulmonary embolism).  Chronic obstructive pulmonary disease (COPD). Medications used to treat COPD can attribute to atrial flutter.  Other related causes:  Hyperthyroidism.  Caffeine.  Some decongestant cold medications.  Low electrolyte levels such as potassium or magnesium.  Cocaine. SYMPTOMS  An awareness of your heart beating rapidly (palpitations).  Shortness of breath.  Chest pain.  Low blood pressure (hypotension).  Dizziness or fainting. DIAGNOSIS  Different tests can be performed to  diagnose atrial flutter.   An EKG.  Holter monitor. This is a 24 hour recording of your heart rhythm. You will also be given a diary. Write down all symptoms that you have and what you were doing at the time you experienced symptoms.  Cardiac event monitor. This small device can be worn for up to 30 days. When you have heart symptoms, you will push a button on the device. This will then record your heart rhythm.  Echocardiogram. This is an imaging test to look at your heart. Your caregiver will look at your heart valves and the ventricles.  Stress Test. This test can help determine if the atrial flutter is related to exercise or if coronary artery disease is present.  Laboratory studies will look at certain blood levels like:  Complete blood count (CBC).  Potassium.  Magnesium.  Thyroid function. TREATMENT  Treatment of atrial flutter varies. A combination of therapies may be used or sometimes atrial flutter may need only 1 type of treatment.  Lab work: If your blood work, such as your electrolytes (potassium, magnesium) or your thyroid function tests are abnormal, your caregiver will treat them accordingly.  Medication:  There are several different types of medications that can convert your heart to a normal rhythm and prevent atrial flutter from reoccurring.  Nonsurgical procedures: Nonsurgical techniques may be used to control atrial flutter. Some examples include:  Cardioversion. This technique uses either drugs or an electrical shock to restore a normal heart rhythm:  Cardioversion drugs may be given through an intravenous (IV) line to help "reset" the heart rhythm.  In electrical cardioversion, your caregiver shocks your heart with electrical energy. This helps  to reset the heartbeat to a normal rhythm.  Ablation. If atrial flutter is a persistent problem, an ablation may be needed. This procedure is done under mild sedation. High frequency radio-wave energy is used to  destroy the area of heart tissue responsible for atrial flutter. SEEK IMMEDIATE MEDICAL CARE IF:   Dizziness.  Near fainting or fainting.  Shortness of breath.  Chest pain or pressure.  Sudden nausea or vomiting.  Profuse sweating. If you have the above symptoms, call your local emergency service immediately! Do not drive yourself to the hospital. MAKE SURE YOU:   Understand these instructions.  Will watch your condition.  Will get help right away if you are not doing well or get worse. Document Released: 04/18/2009 Document Revised: 02/22/2012 Document Reviewed: 04/18/2009 [facility] Patient Information 2015 [place], [place]. This information is not intended to replace advice given to you by your health care provider. Make sure you discuss any questions you have with your health care provider.

## 2014-06-10 NOTE — ED Notes (Signed)
Dr Jeneen Rinks at bedside,

## 2014-06-10 NOTE — ED Notes (Signed)
Pt states that she has felt "bad" all week, fell Tuesday when she lost her balance, hitting her head against a porch rail, denies any LOC, pt feels that she felt bad earlier in the week due to getting too hot on two separate days, got up this am with generalized weakness, did have an episode of "chest heaviness, fast heartbeat, nausea" this am while getting ready for church, all  sensations went away after pt sat down, on arrival to er, pt alert, able to answer questions, denies any chest pressure at present, denies any nausea. Pt is hypertensive with bp of 173/69 in er and was also hypertensive with ems with systolic at 165 upon their arrival to pt, pt on cardiac monitor, NSR  ekg performed,

## 2014-06-11 ENCOUNTER — Other Ambulatory Visit: Payer: Self-pay | Admitting: Internal Medicine

## 2014-06-11 DIAGNOSIS — R9389 Abnormal findings on diagnostic imaging of other specified body structures: Secondary | ICD-10-CM

## 2014-06-12 ENCOUNTER — Ambulatory Visit
Admission: RE | Admit: 2014-06-12 | Discharge: 2014-06-12 | Disposition: A | Discharge status: Home / Self Care | Payer: Medicare Other | Source: Ambulatory Visit | Attending: Internal Medicine | Admitting: Internal Medicine

## 2014-06-12 DIAGNOSIS — R9389 Abnormal findings on diagnostic imaging of other specified body structures: Secondary | ICD-10-CM

## 2014-06-12 MED ORDER — IOHEXOL 300 MG/ML  SOLN
75.0000 mL | Freq: Once | INTRAMUSCULAR | Status: AC | PRN
  Administered 2014-06-12: 75 mL via INTRAVENOUS

## 2014-06-13 ENCOUNTER — Other Ambulatory Visit (HOSPITAL_COMMUNITY): Payer: Self-pay | Admitting: Internal Medicine

## 2014-06-13 ENCOUNTER — Encounter: Payer: Self-pay | Admitting: Pulmonary Disease

## 2014-06-13 ENCOUNTER — Ambulatory Visit (INDEPENDENT_AMBULATORY_CARE_PROVIDER_SITE_OTHER): Payer: Medicare Other | Admitting: Pulmonary Disease

## 2014-06-13 VITALS — BP 130/72 | HR 80 | Temp 97.0°F | Ht 63.0 in | Wt 149.2 lb

## 2014-06-13 DIAGNOSIS — R059 Cough, unspecified: Secondary | ICD-10-CM

## 2014-06-13 DIAGNOSIS — J479 Bronchiectasis, uncomplicated: Secondary | ICD-10-CM

## 2014-06-13 DIAGNOSIS — M949 Disorder of cartilage, unspecified: Principal | ICD-10-CM

## 2014-06-13 DIAGNOSIS — M899 Disorder of bone, unspecified: Secondary | ICD-10-CM

## 2014-06-13 DIAGNOSIS — R053 Chronic cough: Secondary | ICD-10-CM

## 2014-06-13 DIAGNOSIS — R05 Cough: Secondary | ICD-10-CM

## 2014-06-13 NOTE — Patient Instructions (Signed)
Go back to once a day on your reflux medication if you have not already Try and gradually come off tramadol once you have your eye surgery followup with me again in 57mos, and we will do a followup chest xray at that time.

## 2014-06-13 NOTE — Assessment & Plan Note (Signed)
The patient has had a very good response to increasing her proton pump inhibitor to twice a day, and also using tramadol as needed. It seems to have broken the cycle for her cough. I have asked her to go back to once a day on her proton pump inhibitor, and she can use tramadol once or twice a day until she has her eye surgery. Once she has recovered from this, I would like for her to try and wean from this medication.

## 2014-06-13 NOTE — Progress Notes (Signed)
   Subjective:    Patient ID: Elizabeth Sparks, female    DOB: [birth year], 73 y.o.   MRN: [id number]  HPI Patient comes in today for followup of her recent acute exacerbation of cough. 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 She states the cough has improved dramatically, and is down to a 2 on a scale of 0-10. She feels that tramadol has helped break the cycle. Since our last visit, she has been to the emergency room for PAF, and had a chest x-ray there which showed a questionable right upper lobe density. CT chest shows this to be scarlike, although it could be a mucous plug related to her bronchiectasis.   Review of Systems  Constitutional: Negative for fever and unexpected weight change.  HENT: Negative for congestion, dental problem, ear pain, nosebleeds, postnasal drip, rhinorrhea, sinus pressure, sneezing, sore throat and trouble swallowing.   Eyes: Negative for redness and itching.  Respiratory: Positive for cough. Negative for chest tightness, shortness of breath and wheezing.   Cardiovascular: Negative for palpitations and leg swelling.  Gastrointestinal: Negative for nausea and vomiting.  Genitourinary: Negative for dysuria.  Musculoskeletal: Negative for joint swelling.  Skin: Negative for rash.  Neurological: Negative for headaches.  Hematological: Does not bruise/bleed easily.  Psychiatric/Behavioral: Negative for dysphoric mood. The patient is not nervous/anxious.        Objective:   Physical Exam Well-developed female in no acute distress Nose without purulence or discharge noted Neck without lymphadenopathy or thyromegaly Chest with no crackles or wheezes, excellent breath sounds bilaterally Cardiac exam with regular rate and rhythm Lower extremities without edema, no cyanosis Alert and oriented, moves all 4 extremities.       Assessment & Plan:

## 2014-06-13 NOTE — Assessment & Plan Note (Signed)
The patient appears to be doing well from a bronchiectasis standpoint, but her recent chest CT in the emergency room did show some scarring in the right upper lobe. The radiologist has recommended that we watch this area with a followup chest x-ray, and we'll do that at her next six-month appointment.

## 2014-07-02 ENCOUNTER — Ambulatory Visit (HOSPITAL_COMMUNITY)
Admission: RE | Admit: 2014-07-02 | Discharge: 2014-07-02 | Disposition: A | Discharge status: Home / Self Care | Payer: Medicare Other | Source: Ambulatory Visit | Attending: Internal Medicine | Admitting: Internal Medicine

## 2014-07-02 ENCOUNTER — Encounter (HOSPITAL_COMMUNITY)
Admission: RE | Admit: 2014-07-02 | Discharge: 2014-07-02 | Disposition: A | Discharge status: Home / Self Care | Payer: Medicare Other | Source: Ambulatory Visit | Attending: Internal Medicine | Admitting: Internal Medicine

## 2014-07-02 DIAGNOSIS — M899 Disorder of bone, unspecified: Secondary | ICD-10-CM

## 2014-07-02 DIAGNOSIS — M949 Disorder of cartilage, unspecified: Secondary | ICD-10-CM

## 2014-07-02 DIAGNOSIS — M259 Joint disorder, unspecified: Secondary | ICD-10-CM | POA: Insufficient documentation

## 2014-07-02 DIAGNOSIS — C50919 Malignant neoplasm of unspecified site of unspecified female breast: Secondary | ICD-10-CM | POA: Insufficient documentation

## 2014-07-02 MED ORDER — TECHNETIUM TC 99M MEDRONATE IV KIT
25.4000 | PACK | Freq: Once | INTRAVENOUS | Status: AC | PRN
  Administered 2014-07-02: 25.4 via INTRAVENOUS

## 2014-07-06 ENCOUNTER — Other Ambulatory Visit: Payer: Self-pay | Admitting: Pulmonary Disease

## 2014-07-06 ENCOUNTER — Other Ambulatory Visit: Payer: Self-pay | Admitting: *Deleted

## 2014-07-17 ENCOUNTER — Encounter: Payer: Self-pay | Admitting: Cardiology

## 2014-07-17 ENCOUNTER — Encounter: Payer: Self-pay | Admitting: *Deleted

## 2014-07-17 ENCOUNTER — Ambulatory Visit (INDEPENDENT_AMBULATORY_CARE_PROVIDER_SITE_OTHER): Payer: Medicare Other | Admitting: Cardiology

## 2014-07-17 VITALS — BP 138/60 | HR 78 | Ht 63.0 in | Wt 141.0 lb

## 2014-07-17 DIAGNOSIS — I471 Supraventricular tachycardia, unspecified: Secondary | ICD-10-CM

## 2014-07-17 DIAGNOSIS — I498 Other specified cardiac arrhythmias: Secondary | ICD-10-CM

## 2014-07-17 DIAGNOSIS — R0789 Other chest pain: Secondary | ICD-10-CM

## 2014-07-17 DIAGNOSIS — I4891 Unspecified atrial fibrillation: Secondary | ICD-10-CM

## 2014-07-17 DIAGNOSIS — I4719 Other supraventricular tachycardia: Secondary | ICD-10-CM | POA: Insufficient documentation

## 2014-07-17 DIAGNOSIS — J439 Emphysema, unspecified: Secondary | ICD-10-CM

## 2014-07-17 DIAGNOSIS — J438 Other emphysema: Secondary | ICD-10-CM

## 2014-07-17 HISTORY — DX: Unspecified atrial fibrillation: I48.91

## 2014-07-17 MED ORDER — DILTIAZEM HCL ER COATED BEADS 120 MG PO CP24: 120.0000 mg | ORAL_CAPSULE | Freq: Every day | ORAL | Status: DC

## 2014-07-17 NOTE — Progress Notes (Signed)
[place]. [address] [place], [place]  [address] Phone: [phone] Fax:  [phone]  Date:  07/17/2014   ID:  Elizabeth Sparks, DOB 02-12-1941, MRN [id number]  PCP:  Horton Finer, MD   History of Present Illness: Elizabeth Sparks is a 73 y.o. female here for the evaluation of new-onset atrial flutter (after careful review of strips, I do believe that this is actually atrial tachycardia). Has past medical history of hypertension, hyperlipidemia, COPD with prior history of cardiac catheterization showing minor CAD several years ago. She quit smoking in 2010, has one glass of wine daily. Has a history of right breast cancer with radiation therapy and lumpectomy in 2012.  She was in any pain emergency department on 06/10/14 and brings with me a strip from telemetry. Her heart rate abruptly increased to 130 beats per minute and appears to be paroxysmal atrial tachycardia as there is a change in T-wave morphology with buried P wave within. This strip does not demonstrate atrial flutter or atrial fibrillation.  This episode started as she was going to church. She felt her heart begin to race periodically then he began to race somewhat incessantly. This was accompanied with central chest tightness which was concerning for her and then she started to get very anxious, called 911, chewed aspirin, started to feel tingling in her arms. Evaluation at emergency department demonstrated atrial tachycardia on telemetry.  She's had no strokelike symptoms, no fevers, no recent surgeries. She is anticipating an upcoming cataract surgery. She has had COPD, Dr. Gwenette Greet. Recent episode of heavy coughing.  I personally reviewed her CT scan, she does have coronary artery calcification in her LAD proximally.   Wt Readings from Last 3 Encounters:  07/17/14 141 lb (63.957 kg)  06/13/14 149 lb 3.2 oz (67.677 kg)  06/10/14 149 lb (67.586 kg)     Past Medical History  Diagnosis Date  .  Arthritis   . COPD (chronic obstructive pulmonary disease)   . Hypertension   . Thyroid disease   . Wheezing   . Shortness of breath   . GERD (gastroesophageal reflux disease)   . Complication of anesthesia     has had ilius  . Cancer     right breast  . S/P radiation therapy comp. 02/10/12    42.5 Gy / 16 Fractions to Right Breast at [facility]  . History of ongoing treatment with alendronate (Fosamax)   . Breast cancer   . Atrial fibrillation     Past Surgical History  Procedure Laterality Date  . Rotator cuff repair  2001    left  . Pelvic tumors  2009  . Hernia repair  2011    abd wall hernia  . Abdominal hysterectomy  1974    part hyst  . Uterine fibroid surgery  2009    ovaries out  . Mastectomy, partial  11/20/11    right breast  . Breast surgery  11/20/2011    Rt partial mastectomy    Current Outpatient Prescriptions  Medication Sig Dispense Refill  . alendronate (FOSAMAX) 70 MG tablet Take 70 mg by mouth every 7 (seven) days. Take with a full glass of water on an empty stomach.      Marland Kitchen aspirin 81 MG tablet Take 325 mg by mouth daily.       . budesonide-formoterol (SYMBICORT) 160-4.5 MCG/ACT inhaler Inhale 2 puffs into the lungs 2 (two) times daily.        Marland Kitchen  calcium carbonate (OS-CAL) 600 MG TABS Take 600 mg by mouth daily.      . Cholecalciferol (VITAMIN D-3 PO) Take by mouth daily.        Marland Kitchen esomeprazole (NEXIUM) 20 MG capsule Take 40 mg by mouth daily at 12 noon.       . hydrochlorothiazide (HYDRODIURIL) 25 MG tablet Take 25 mg by mouth daily.        Marland Kitchen KLOR-CON 10 10 MEQ tablet Take 10 mEq by mouth daily.       Marland Kitchen losartan (COZAAR) 100 MG tablet Take 100 mg by mouth daily.      . simvastatin (ZOCOR) 10 MG tablet Take 10 mg by mouth at bedtime.        Marland Kitchen tiotropium (SPIRIVA) 18 MCG inhalation capsule Place 18 mcg into inhaler and inhale daily.        . traMADol (ULTRAM) 50 MG tablet TAKE 1 TABLET BY MOUTH EVERY 6 HOURS AS NEEDED  30 tablet  0    No current facility-administered medications for this visit.    Allergies:    Allergies  Allergen Reactions  . Codeine     REACTION: headache and nausea  . Evista [Raloxifene]   . Procaine Hcl     REACTION: headaches    Social History:  The patient  reports that she quit smoking about 4 years ago. Her smoking use included Cigarettes. She has a 22.5 pack-year smoking history. She does not have any smokeless tobacco history on file. She reports that she drinks about 1.2 ounces of alcohol per week. She reports that she does not use illicit drugs.  To take care of her husband.  Family History  Problem Relation Age of Onset  . Heart disease Mother   . Cancer Father     prostate  . Heart disease Father   . Cancer Maternal Grandmother     liver    ROS:  Please see the history of present illness.   No bleeding, no syncope, no orthopnea, no PND. Positive cough, palpitations. Prior right sided breast discoloration intermittently. Told this was radiation induced.   All other systems reviewed and negative.   PHYSICAL EXAM: VS:  BP 138/60  Pulse 78  Ht 5\' 3"  (1.6 m)  Wt 141 lb (63.957 kg)  BMI 24.98 kg/m2 Well nourished, well developed, in no acute distress HEENT: normal, [place]/AT, EOMI Neck: no JVD, normal carotid upstroke, no bruit Cardiac:  normal S1, S2; RRR; no murmur Lungs:  clear to auscultation bilaterally, no wheezing, rhonchi or rales Abd: soft, nontender, no hepatomegaly, no bruits Ext: no edema, 2+ distal pulses Skin: warm and dry GU: deferred Neuro: no focal abnormalities noted, AAO x 3  EKG:  Sinus rhythm with no other significant abnormalities. Telemetry demonstrates PAC, onset of atrial tachycardia. I do not see flutter waves.  Labs: 06/10/14-sodium 137, potassium 3.8, creatinine 0.6, troponin 0.3 , Hemoglobin 13.2, glucose 112, TSH 1.6-normal  ASSESSMENT AND PLAN:  1. Paroxysmal supraventricular tachycardia-paroxysmal atrial tachycardia-I do not believe this is  atrial flutter or atrial fibrillation. This does not require anticoagulation. I will resume Cardizem CD 120 mg once a day. Hopefully this will help decrease the overall burden of PAT. I will check an echocardiogram to ensure proper structure and function of her heart (prior radiation). TSH was normal. Avoid excessive caffeine use, she states that she does not drink caffeine. Avoid heavy alcohol use, she usually drinks one to 2 glasses of wine daily. Also, with her chest tightness  felt during her paroxysmal atrial tachycardia, this may be indicative of worsening CAD. There is coronary artery calcification (LAD) as well as aortic atherosclerosis seen on CT scan. If her symptoms of chest tightness return, we will proceed with nuclear stress test for further evaluation. She will let me know. I will see her back in 6 months. 2. COPD/emphysema-certainly underlying lung disease can exacerbate atrial tachycardias, usually multifocal atrial tachycardia. Continue with Cardizem.  Signed, Candee Furbish, MD [facility]  07/17/2014 2:20 PM

## 2014-07-17 NOTE — Patient Instructions (Signed)
Please start Diltiazem 120 mg a day. Continue all other medications as listed.  Your physician has requested that you have an echocardiogram. Echocardiography is a painless test that uses sound waves to create images of your heart. It provides your doctor with information about the size and shape of your heart and how well your heart's chambers and valves are working. This procedure takes approximately one hour. There are no restrictions for this procedure.  Follow up in 6 months with Dr Marlou Porch.  You will receive a letter in the mail 2 months before you are due.  Please call us when you receive this letter to schedule your follow up appointment.

## 2014-07-23 ENCOUNTER — Ambulatory Visit (HOSPITAL_COMMUNITY): Payer: Medicare Other | Attending: Cardiology | Admitting: Cardiology

## 2014-07-23 DIAGNOSIS — I471 Supraventricular tachycardia, unspecified: Secondary | ICD-10-CM | POA: Insufficient documentation

## 2014-07-23 DIAGNOSIS — I498 Other specified cardiac arrhythmias: Secondary | ICD-10-CM

## 2014-07-23 DIAGNOSIS — I4892 Unspecified atrial flutter: Secondary | ICD-10-CM

## 2014-07-23 NOTE — Progress Notes (Signed)
Echo performed. 

## 2014-07-24 ENCOUNTER — Encounter: Payer: Self-pay | Admitting: Radiation Oncology

## 2014-07-25 ENCOUNTER — Other Ambulatory Visit: Payer: Self-pay | Admitting: Pulmonary Disease

## 2014-07-25 ENCOUNTER — Ambulatory Visit: Payer: Medicare Other | Admitting: Cardiology

## 2014-07-26 ENCOUNTER — Telehealth: Payer: Self-pay | Admitting: Cardiology

## 2014-07-26 NOTE — Telephone Encounter (Signed)
Called pt and reviewed results of 2 D echo with her.

## 2014-07-26 NOTE — Telephone Encounter (Signed)
New message     Returning [name]'s call.  Please call after 12:00 on cell phone.

## 2014-07-27 ENCOUNTER — Telehealth: Payer: Self-pay | Admitting: Pulmonary Disease

## 2014-07-27 ENCOUNTER — Ambulatory Visit: Admission: RE | Admit: 2014-07-27 | Payer: MEDICARE | Source: Ambulatory Visit | Admitting: Radiation Oncology

## 2014-07-27 MED ORDER — TRAMADOL HCL 50 MG PO TABS: 50.0000 mg | ORAL_TABLET | Freq: Two times a day (BID) | ORAL | Status: DC | PRN

## 2014-07-27 NOTE — Telephone Encounter (Signed)
I would like for her to go back to twice a day on her reflux medication Ok to refill tramadol bid AS NEEDED ONLY>  (#60, no fills) I am hoping that with the increased reflux med, she will need the tramadol less.  Would like to try and get her off the tramadol. Make sure she has a f/u with me..? 5mos.

## 2014-07-27 NOTE — Telephone Encounter (Signed)
Upcoming visit with [facility] 12/10/14 @ 11a--reminded pt Pt aware of rec's per [facility] Rx for Tramadol Phoned in to [facility] BID PRN Nothing further needed.

## 2014-07-27 NOTE — Telephone Encounter (Signed)
Per 06/13/14 OV; Patient Instructions      Go back to once a day on your reflux medication if you have not already Try and gradually come off tramadol once you have your eye surgery followup with me again in 46mos, and we will do a followup chest xray at that time    Spoke w/ pt. She reports she needs a refill on her tramadol. She reports at 7 am today she woke up coughing non stop. It is causing her to vomit. Pt reports when she takes the tramadol she does not cough at all. She has not had any since Wed and that is when cough started. She takes 1 BID.  Please advise Shiner thanks

## 2014-07-30 ENCOUNTER — Telehealth: Payer: Self-pay | Admitting: Cardiology

## 2014-07-30 NOTE — Telephone Encounter (Signed)
New message      Patient is having extreme nausea.  Could it be from the diltiazem?

## 2014-07-30 NOTE — Telephone Encounter (Signed)
Agree. Jonee Lamore, MD  

## 2014-07-30 NOTE — Telephone Encounter (Signed)
Pt states she has been on diltiazem for about 10 days. She does feel diltiazem helps control her palpitations. She developed nausea starting last Wednesday. She is asking if nausea could be related to diltiazem.  Ysidro Evert, pharmacist states symptoms possibly related to diltiazem, since GI side effects can be seen with CCBs as a class. He suggested pt try taking medication at a different time of day, evening instead of morning. If nausea does not improve after trying a different time of day pt should call back and will possibly need to consider medication change.   Pt advised, verbalized understanding, agreed with this plan.

## 2014-08-10 ENCOUNTER — Encounter: Payer: Self-pay | Admitting: Radiation Oncology

## 2014-08-10 ENCOUNTER — Ambulatory Visit
Admission: RE | Admit: 2014-08-10 | Discharge: 2014-08-10 | Disposition: A | Discharge status: Home / Self Care | Payer: Medicare Other | Source: Ambulatory Visit | Attending: Radiation Oncology | Admitting: Radiation Oncology

## 2014-08-10 VITALS — BP 137/54 | HR 75 | Temp 98.0°F | Ht 63.0 in | Wt 146.2 lb

## 2014-08-10 DIAGNOSIS — C50111 Malignant neoplasm of central portion of right female breast: Secondary | ICD-10-CM

## 2014-08-10 NOTE — Progress Notes (Signed)
Radiation Oncology         [phone] ________________________________  Name: Elizabeth Sparks MRN: [id number]  Date: 08/10/2014  DOB: 1940-12-29  Follow-Up Visit Note  Outpatient  CC: Horton Finer, MD  Horton Finer,*  Diagnosis and Prior Radiotherapy:   [id number] right breast invasive ductal carcinoma, grade 2, ER/PR positive, HER-2/neu negative  She received 42.56 Gray in 16 fractions completed 02/10/2012 to her right breast in [place], [place]   Narrative:  The patient returns today for routine follow-up. Had an episode months ago where breast was red/blue and swollen suddenly after a shower. Not taking new meds at the time.  Resolved with doxycycline.  Mammogram was negative in Oct, due for annual study in 2 months. Taking Fosamax.                           ALLERGIES:  is allergic to codeine; evista; and procaine hcl.  Meds: Current Outpatient Prescriptions  Medication Sig Dispense Refill  . alendronate (FOSAMAX) 70 MG tablet Take 70 mg by mouth every 7 (seven) days. Take with a full glass of water on an empty stomach.      Marland Kitchen aspirin 81 MG tablet Take 325 mg by mouth daily.       . budesonide-formoterol (SYMBICORT) 160-4.5 MCG/ACT inhaler Inhale 2 puffs into the lungs 2 (two) times daily.        . calcium carbonate (OS-CAL) 600 MG TABS Take 600 mg by mouth daily.      . Cholecalciferol (VITAMIN D-3 PO) Take by mouth daily.        Marland Kitchen diltiazem (CARDIZEM CD) 120 MG 24 hr capsule Take 1 capsule (120 mg total) by mouth daily.  90 capsule  3  . esomeprazole (NEXIUM) 20 MG capsule Take 40 mg by mouth daily at 12 noon.       . hydrochlorothiazide (HYDRODIURIL) 25 MG tablet Take 25 mg by mouth daily.        Marland Kitchen KLOR-CON 10 10 MEQ tablet Take 10 mEq by mouth daily.       Marland Kitchen losartan (COZAAR) 100 MG tablet Take 100 mg by mouth daily.      . simvastatin (ZOCOR) 10 MG tablet Take 10 mg by mouth at bedtime.        Marland Kitchen tiotropium (SPIRIVA) 18 MCG inhalation capsule Place 18 mcg into  inhaler and inhale daily.        . traMADol (ULTRAM) 50 MG tablet Take 1 tablet (50 mg total) by mouth 2 (two) times daily as needed.  60 tablet  0   No current facility-administered medications for this encounter.    Physical Findings: The patient is in no acute distress. Patient is alert and oriented.  height is '5\' 3"'  (1.6 m) and weight is 146 lb 3.2 oz (66.316 kg). Her temperature is 98 F (36.7 C). Her blood pressure is 137/54 and her pulse is 75. Marland Kitchen   No palpable lesions of concern in either breast or axillae. Skin unremarkable.  Neck without adenopathy. Lungs clear. Heart RRR   Lab Findings: Lab Results  Component Value Date   WBC 7.5 06/10/2014   HGB 13.2 06/10/2014   HCT 39.0 06/10/2014   MCV 83.9 06/10/2014   PLT 237 06/10/2014    Radiographic Findings: No results found.  Impression/Plan:  I encouraged her to continue with yearly mammography and followup with me in 1 year.    Plans for CXR in Dec with  Dr Gwenette Greet to f/u small lung lesion that appears to be most likely benign.  I reviewed the CT images of her chest from June and the lesion does not to be from RT, which tends to scar diffusely, in a rind adjacent to the chest wall.   _____________________________________   Eppie Gibson, MD

## 2014-08-13 ENCOUNTER — Telehealth: Payer: Self-pay | Admitting: *Deleted

## 2014-08-13 NOTE — Telephone Encounter (Signed)
CALLED PATIENT TO INFORM OF MAMMO FOR 10-11-14 - ARRIVAL TIME - 1:15 PM @ [facility] AND HER FU WITH DR. Isidore Moos ON 08-09-15 @ 3 PM, LVM FOR A RETURN CALL

## 2014-10-01 ENCOUNTER — Encounter: Payer: Self-pay | Admitting: Adult Health

## 2014-10-01 ENCOUNTER — Ambulatory Visit (INDEPENDENT_AMBULATORY_CARE_PROVIDER_SITE_OTHER): Payer: Medicare Other | Admitting: Adult Health

## 2014-10-01 VITALS — BP 126/68 | HR 76 | Temp 97.0°F | Ht 63.75 in | Wt 142.4 lb

## 2014-10-01 DIAGNOSIS — J439 Emphysema, unspecified: Secondary | ICD-10-CM

## 2014-10-01 MED ORDER — AZITHROMYCIN 250 MG PO TABS: ORAL_TABLET | ORAL | Status: AC

## 2014-10-01 MED ORDER — TRAMADOL HCL 50 MG PO TABS: 50.0000 mg | ORAL_TABLET | Freq: Four times a day (QID) | ORAL | Status: DC | PRN

## 2014-10-01 NOTE — Patient Instructions (Signed)
Zpack take as directed.  Mucinex DM Twice daily  As needed  Cough/congestion  May use Tramadol 50mg  every 6hr  As needed  Cough, may make you sleepy.  Please contact office for sooner follow up if symptoms do not improve or worsen or seek emergency care

## 2014-10-02 NOTE — Assessment & Plan Note (Signed)
Mild flare with URI   Plan  Zpack take as directed.  Mucinex DM Twice daily  As needed  Cough/congestion  May use Tramadol 50mg  every 6hr  As needed  Cough, may make you sleepy.  Please contact office for sooner follow up if symptoms do not improve or worsen or seek emergency care

## 2014-10-02 NOTE — Progress Notes (Signed)
   Subjective:    Patient ID: Elizabeth Sparks, female    DOB: 1941-07-31, 73 y.o.   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   10/01/14 Acute OV  Complains of hoarseness, some wheezing, prod cough with green mucus x3days. Mucinex helps some .  Denies any increased SOB, tightness, f/c/s, n/v/d, hemoptysis.  No recent travel or abx use.  Requests refill of tramadol as it helps her cough the mose.      Review of Systems Constitutional:   No  weight loss, night sweats,  Fevers, chills,  +fatigue, or  lassitude.  HEENT:   No headaches,  Difficulty swallowing,  Tooth/dental problems, or  Sore throat,                No sneezing, itching, ear ache, nasal congestion, post nasal drip,   CV:  No chest pain,  Orthopnea, PND, swelling in lower extremities, anasarca, dizziness, palpitations, syncope.   GI  No heartburn, indigestion, abdominal pain, nausea, vomiting, diarrhea, change in bowel habits, loss of appetite, bloody stools.   Resp:  .  No chest wall deformity  Skin: no rash or lesions.  GU: no dysuria, change in color of urine, no urgency or frequency.  No flank pain, no hematuria   MS:  No joint pain or swelling.  No decreased range of motion.  No back pain.  Psych:  No change in mood or affect. No depression or anxiety.  No memory loss.          Objective:   Physical Exam GEN: A/Ox3; pleasant , NAD, elderly   HEENT:  [place]/AT,  EACs-clear, TMs-wnl, NOSE-clear, THROAT-clear, no lesions, no postnasal drip or exudate noted.   NECK:  Supple w/ fair ROM; no JVD; normal carotid impulses w/o bruits; no thyromegaly or nodules palpated; no lymphadenopathy.  RESP  Decreased BS in bases  w/o, wheezes/ rales/ or rhonchi.no accessory muscle use, no dullness to percussion  CARD:  RRR, no m/r/g  , no peripheral edema, pulses intact, no cyanosis or clubbing.  GI:   Soft & nt; nml bowel sounds; no organomegaly  or masses detected.  Musco: Warm bil, no deformities or joint swelling noted.   Neuro: alert, no focal deficits noted.    Skin: Warm, no lesions or rashes         Assessment & Plan:

## 2014-10-11 ENCOUNTER — Ambulatory Visit
Admission: RE | Admit: 2014-10-11 | Discharge: 2014-10-11 | Disposition: A | Discharge status: Home / Self Care | Payer: Medicare Other | Source: Ambulatory Visit | Attending: Radiation Oncology | Admitting: Radiation Oncology

## 2014-10-11 DIAGNOSIS — C50111 Malignant neoplasm of central portion of right female breast: Secondary | ICD-10-CM

## 2014-11-09 ENCOUNTER — Telehealth: Payer: Self-pay | Admitting: Pulmonary Disease

## 2014-11-09 MED ORDER — TRAMADOL HCL 50 MG PO TABS: 50.0000 mg | ORAL_TABLET | Freq: Four times a day (QID) | ORAL | Status: DC | PRN

## 2014-11-09 NOTE — Telephone Encounter (Signed)
Pt having increased cough--worse at night. Unable to sleep d/t cough. Requesting refill of Tramadol be sent to [place], [place]  Please advise Dr Melvyn Novas as Dr Gwenette Greet is not in office and pt needs this today. Thanks.

## 2014-11-09 NOTE — Telephone Encounter (Signed)
Ok x one refill but be sure has f/u with [place] scheduled in the next 2 weeks

## 2014-11-09 NOTE — Telephone Encounter (Signed)
Spoke with pt's spouse, he is aware that pt needs to keep rov already scheduled next month with [facility] and will get one refill.  Nothing further needed.

## 2014-11-12 ENCOUNTER — Ambulatory Visit (INDEPENDENT_AMBULATORY_CARE_PROVIDER_SITE_OTHER)
Admission: RE | Admit: 2014-11-12 | Discharge: 2014-11-12 | Disposition: A | Discharge status: Home / Self Care | Payer: Medicare Other | Source: Ambulatory Visit | Attending: Pulmonary Disease | Admitting: Pulmonary Disease

## 2014-11-12 ENCOUNTER — Ambulatory Visit (INDEPENDENT_AMBULATORY_CARE_PROVIDER_SITE_OTHER): Payer: Medicare Other | Admitting: Pulmonary Disease

## 2014-11-12 ENCOUNTER — Encounter: Payer: Self-pay | Admitting: Pulmonary Disease

## 2014-11-12 VITALS — BP 128/64 | HR 87 | Temp 98.0°F | Ht 63.75 in | Wt 140.0 lb

## 2014-11-12 DIAGNOSIS — J438 Other emphysema: Secondary | ICD-10-CM

## 2014-11-12 DIAGNOSIS — J479 Bronchiectasis, uncomplicated: Secondary | ICD-10-CM

## 2014-11-12 DIAGNOSIS — R053 Chronic cough: Secondary | ICD-10-CM

## 2014-11-12 DIAGNOSIS — R05 Cough: Secondary | ICD-10-CM

## 2014-11-12 MED ORDER — CIPROFLOXACIN HCL 500 MG PO TABS: 500.0000 mg | ORAL_TABLET | Freq: Two times a day (BID) | ORAL | Status: DC

## 2014-11-12 NOTE — Patient Instructions (Signed)
Will treat with ciprofloxacin 500mg  am and pm for 7 days Will check chest xray today, and call you with results. Get back on tramadol 50mg  one every 6 hrs no matter what for 3 days, then go back to taking as needed. Continue with hard candy, and limit voice use to help with cough. Please call me if your cough does not significantly improve.  Keep scheduled followup with me.

## 2014-11-12 NOTE — Progress Notes (Signed)
   Subjective:    Patient ID: Elizabeth Sparks, female    DOB: 09/10/1941, 73 y.o.   MRN: [id number]  HPI The patient comes in today for an acute sick visit. She has known COPD, along with mild bronchiectasis. She also has a chronic cough that is felt secondary to the irritable larynx syndrome. She was doing fairly well until approximately a few weeks ago when she began to have severe cough paroxysms. She had very little mucus associated with this, and denied sinus symptoms or increased reflux. She was seen by our nurse practitioner, and treated with a course of azithromycin without significant change. She has been taking tramadol which normally helps her cough, but it has not improved things significantly this time around. She denies any chest congestion, and her mucus is very scant. It is mildly discolored, and has had some streaks of blood which is new for her. She describes classical cough paroxysms which are upper airway in origin. Unfortunately, she is not able to take any narcotic-containing cough syrup. She has not had increasing shortness of breath.   Review of Systems  Constitutional: Negative for fever and unexpected weight change.  HENT: Positive for sore throat. Negative for congestion, dental problem, ear pain, nosebleeds, postnasal drip, rhinorrhea, sinus pressure, sneezing and trouble swallowing.   Eyes: Negative for redness and itching.  Respiratory: Positive for cough, chest tightness and shortness of breath. Negative for wheezing.   Cardiovascular: Negative for palpitations and leg swelling.  Gastrointestinal: Negative for nausea and vomiting.  Genitourinary: Negative for dysuria.  Musculoskeletal: Negative for joint swelling.  Skin: Negative for rash.  Neurological: Positive for headaches.  Hematological: Does not bruise/bleed easily.  Psychiatric/Behavioral: Negative for dysphoric mood. The patient is not nervous/anxious.        Objective:   Physical  Exam Well-developed female in no acute distress Nose without purulence or discharge noted Neck without lymphadenopathy or thyromegaly Chest with fairly clear breath sounds, no wheezing Heart exam with regular rate and rhythm Lower extremities without edema, no cyanosis Alert and oriented, moves all 4 extremities.       Assessment & Plan:

## 2014-11-12 NOTE — Assessment & Plan Note (Signed)
The patient is not having significant mucus production or chest congestion, but will treat with a short course of antibiotics since she is having some streaky hemoptysis.

## 2014-11-12 NOTE — Assessment & Plan Note (Signed)
The patient continues to have what primarily sounds like an upper airway cough. There is very little mucus at this time, but I will give her the benefit of the doubt since she has underlying bronchiectasis. I will treat her with a course of ciprofloxacin, and also work on cough suppression with tramadol. Since she responds so well to this, I think I will try gabapentin at some point for long-term suppression.  We will also check a chest x-ray today for completeness. I have reminded the patient about the behavioral therapy for cyclical coughing, and to avoid throat clearing and overuse of her voice.

## 2014-11-13 ENCOUNTER — Telehealth: Payer: Self-pay | Admitting: Pulmonary Disease

## 2014-11-13 NOTE — Telephone Encounter (Signed)
Result Note     Please let pt know that her cxr is stable from last check, no pneumonia or anything else to explain why her cough suddenly worsened. Will stick to current plan   I spoke with patient about results and she verbalized understanding and had no questions

## 2014-12-03 ENCOUNTER — Encounter: Payer: Self-pay | Admitting: Pulmonary Disease

## 2014-12-03 ENCOUNTER — Ambulatory Visit (INDEPENDENT_AMBULATORY_CARE_PROVIDER_SITE_OTHER): Payer: Medicare Other | Admitting: Pulmonary Disease

## 2014-12-03 VITALS — BP 120/62 | HR 80 | Temp 97.0°F | Ht 63.0 in | Wt 140.6 lb

## 2014-12-03 DIAGNOSIS — R053 Chronic cough: Secondary | ICD-10-CM

## 2014-12-03 DIAGNOSIS — J479 Bronchiectasis, uncomplicated: Secondary | ICD-10-CM

## 2014-12-03 DIAGNOSIS — R05 Cough: Secondary | ICD-10-CM

## 2014-12-03 DIAGNOSIS — J439 Emphysema, unspecified: Secondary | ICD-10-CM

## 2014-12-03 MED ORDER — GABAPENTIN 300 MG PO CAPS: 300.0000 mg | ORAL_CAPSULE | Freq: Two times a day (BID) | ORAL | Status: DC

## 2014-12-03 NOTE — Progress Notes (Signed)
   Subjective:    Patient ID: Elizabeth Sparks, female    DOB: February 04, 1941, 73 y.o.   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 I last saw the patient with a bronchiectasis flare, and she was treated with a round of ciprofloxacin with a good response to treatment. Despite this, she continues to have a globus sensation in her throat, and continues to clear her throat. She is using tramadol as needed, and also hard candy and avoiding overuse of her voice. Her current cough is felt to be related to the irritable larynx syndrome rather than lower airways disease. She is continuing on her bronchodilator regimen, and has not seen any worsening of her breathing. Of note, her chest x-ray at the last visit was completely stable with no new infiltrates.   Review of Systems  Constitutional: Negative for fever and unexpected weight change.  HENT: Positive for sore throat. Negative for congestion, dental problem, ear pain, nosebleeds, postnasal drip, rhinorrhea, sinus pressure, sneezing and trouble swallowing.   Eyes: Negative for redness and itching.  Respiratory: Positive for cough, shortness of breath and wheezing. Negative for chest tightness.   Cardiovascular: Negative for palpitations and leg swelling.  Gastrointestinal: Negative for nausea and vomiting.  Genitourinary: Negative for dysuria.  Musculoskeletal: Negative for joint swelling.  Skin: Negative for rash.  Neurological: Negative for headaches.  Hematological: Does not bruise/bleed easily.  Psychiatric/Behavioral: Negative for dysphoric mood. The patient is not nervous/anxious.        Objective:   Physical Exam Well-developed female in no acute distress Nose without purulence or discharge noted Neck without lymphadenopathy or thyromegaly Chest with fairly clear breath  sounds, no wheezing Cardiac exam with regular rate and rhythm Her extremities without edema, no cyanosis Alert and oriented, moves all 4 extremities.       Assessment & Plan:

## 2014-12-03 NOTE — Patient Instructions (Signed)
Will start on gabapentin 300mg  one in am and pm  Try not to use tramadol unless absolutely needed. Continue with limiting voice use, no throat clearing, hard candy Will give you a sputum cup to take with you, and bring to lab when you can produce a good specimen if sick followup with me once you get back from [place] so we can check on your progress.   Let us know if the gabapentin is not helping, though may take 3-4 weeks.

## 2014-12-03 NOTE — Assessment & Plan Note (Signed)
The patient appears to be doing fairly well from a COPD standpoint, and is continuing on her bronchodilator regimen.

## 2014-12-03 NOTE — Assessment & Plan Note (Signed)
The patient currently has resolved her recent bronchiectasis exacerbation, and has minimal mucus at this time. She no longer feels increased chest congestion. I would like to give her a culture,, so that she can bring a specimen by when she has her next flare.

## 2014-12-03 NOTE — Assessment & Plan Note (Signed)
The patient continues to have a globus sensation, and also does some throat clearing. He is trying to stick with the behavioral therapies as well as hard candy, and believes tramadol helps her when needed. I do not think that her chronic cough is related to lower respiratory disease, and we'll therefore try her on gabapentin for the irritable larynx syndrome.

## 2014-12-10 ENCOUNTER — Ambulatory Visit: Payer: Medicare Other | Admitting: Pulmonary Disease

## 2014-12-24 ENCOUNTER — Ambulatory Visit
Admission: RE | Admit: 2014-12-24 | Discharge: 2014-12-24 | Disposition: A | Discharge status: Home / Self Care | Payer: Self-pay | Source: Ambulatory Visit | Attending: Nurse Practitioner | Admitting: Nurse Practitioner

## 2014-12-24 ENCOUNTER — Other Ambulatory Visit: Payer: Self-pay | Admitting: Nurse Practitioner

## 2014-12-24 DIAGNOSIS — J069 Acute upper respiratory infection, unspecified: Secondary | ICD-10-CM

## 2014-12-26 ENCOUNTER — Other Ambulatory Visit: Payer: Self-pay | Admitting: Pulmonary Disease

## 2014-12-26 ENCOUNTER — Other Ambulatory Visit: Payer: Self-pay

## 2014-12-26 DIAGNOSIS — J479 Bronchiectasis, uncomplicated: Secondary | ICD-10-CM

## 2014-12-29 LAB — RESPIRATORY CULTURE OR RESPIRATORY AND SPUTUM CULTURE
Culture: NORMAL
Organism ID, Bacteria: NORMAL

## 2015-01-22 ENCOUNTER — Other Ambulatory Visit: Payer: Self-pay | Admitting: Internal Medicine

## 2015-01-22 ENCOUNTER — Ambulatory Visit
Admission: RE | Admit: 2015-01-22 | Discharge: 2015-01-22 | Disposition: A | Discharge status: Home / Self Care | Payer: Medicare Other | Source: Ambulatory Visit | Attending: Internal Medicine | Admitting: Internal Medicine

## 2015-01-22 DIAGNOSIS — J069 Acute upper respiratory infection, unspecified: Secondary | ICD-10-CM

## 2015-02-07 LAB — AFB CULTURE WITH SMEAR (NOT AT ARMC): Acid Fast Smear: NONE SEEN

## 2015-03-05 ENCOUNTER — Encounter: Payer: Self-pay | Admitting: Cardiology

## 2015-03-05 ENCOUNTER — Ambulatory Visit (INDEPENDENT_AMBULATORY_CARE_PROVIDER_SITE_OTHER): Payer: Medicare Other | Admitting: Cardiology

## 2015-03-05 ENCOUNTER — Other Ambulatory Visit: Payer: Self-pay | Admitting: *Deleted

## 2015-03-05 VITALS — BP 102/62 | HR 88 | Ht 63.0 in | Wt 137.0 lb

## 2015-03-05 DIAGNOSIS — I471 Supraventricular tachycardia: Secondary | ICD-10-CM

## 2015-03-05 NOTE — Progress Notes (Signed)
[facility]. [address] [place], [place]  [address] Phone: [phone] Fax:  [phone]  Date:  03/05/2015   ID:  Elizabeth Sparks, DOB [birth year], MRN [id number]  PCP:  Dorian Heckle, MD   History of Present Illness: Elizabeth Sparks is a 74 y.o. female here for follow up of atrial tachycardia.  Has past medical history of hypertension, hyperlipidemia, COPD with prior history of cardiac catheterization showing minor CAD several years ago. She quit smoking in 2010, has one glass of wine daily. Has a history of right breast cancer with radiation therapy and lumpectomy in 2012.  She was in any pain emergency department on 06/10/14 and brings with me a strip from telemetry. Her heart rate abruptly increased to 130 beats per minute and appears to be paroxysmal atrial tachycardia as there is a change in T-wave morphology with buried P wave within. This strip does not demonstrate atrial flutter or atrial fibrillation.  This episode started as she was going to church. She felt her heart begin to race periodically then he began to race somewhat incessantly. This was accompanied with central chest tightness which was concerning for her and then she started to get very anxious, called 911, chewed aspirin, started to feel tingling in her arms. Evaluation at emergency department demonstrated atrial tachycardia on telemetry.  She's had no strokelike symptoms, no fevers, no recent surgeries. She is anticipating an upcoming cataract surgery. She has had COPD, Dr. Gwenette Greet. Recent episode of heavy coughing.  I personally reviewed her CT scan, she does have coronary artery calcification in her LAD proximally.  03/05/15 - Elizabeth Sparks had pneumonia. Had racing episode with fever. Overall she has been doing well since then. She does have some equilibrium issues. She has fallen, no broken bones.  Wt Readings from Last 3 Encounters:  03/05/15 137 lb (62.143 kg)  12/03/14 140 lb 9.6 oz (63.776 kg)  11/12/14  140 lb (63.504 kg)     Past Medical History  Diagnosis Date  . Arthritis   . COPD (chronic obstructive pulmonary disease)   . Hypertension   . Thyroid disease   . Wheezing   . Shortness of breath   . GERD (gastroesophageal reflux disease)   . Complication of anesthesia     has had ilius  . Cancer     right breast  . S/P radiation therapy comp. 02/10/12    42.5 Gy / 16 Fractions to Right Breast at [facility]  . History of ongoing treatment with alendronate (Fosamax)   . Breast cancer   . Atrial fibrillation 07/17/14    Past Surgical History  Procedure Laterality Date  . Rotator cuff repair  2001    left  . Pelvic tumors  2009  . Hernia repair  2011    abd wall hernia  . Abdominal hysterectomy  1974    part hyst  . Uterine fibroid surgery  2009    ovaries out  . Mastectomy, partial  11/20/11    right breast  . Breast surgery  11/20/2011    Rt partial mastectomy    Current Outpatient Prescriptions  Medication Sig Dispense Refill  . alendronate (FOSAMAX) 70 MG tablet Take 70 mg by mouth every 7 (seven) days. Take with a full glass of water on an empty stomach.    Marland Kitchen aspirin 81 MG tablet Take 81 mg by mouth daily.     . budesonide-formoterol (SYMBICORT) 160-4.5 MCG/ACT inhaler Inhale 2 puffs into the  lungs 2 (two) times daily.      . calcium carbonate (OS-CAL) 600 MG TABS Take 600 mg by mouth daily.    . Cholecalciferol (VITAMIN D-3 PO) Take by mouth daily.      Marland Kitchen diltiazem (CARDIZEM CD) 120 MG 24 hr capsule Take 1 capsule (120 mg total) by mouth daily. 90 capsule 3  . esomeprazole (NEXIUM) 20 MG capsule Take 40 mg by mouth daily at 12 noon.     . gabapentin (NEURONTIN) 300 MG capsule Take 1 capsule (300 mg total) by mouth 2 (two) times daily. 60 capsule 6  . hydrochlorothiazide (HYDRODIURIL) 25 MG tablet Take 25 mg by mouth daily.      Marland Kitchen KLOR-CON 10 10 MEQ tablet Take 10 mEq by mouth daily.     Marland Kitchen losartan (COZAAR) 100 MG tablet Take 100 mg by mouth daily.     . simvastatin (ZOCOR) 10 MG tablet Take 10 mg by mouth at bedtime.      Marland Kitchen tiotropium (SPIRIVA) 18 MCG inhalation capsule Place 18 mcg into inhaler and inhale daily.      . traMADol (ULTRAM) 50 MG tablet Take 1 tablet (50 mg total) by mouth every 6 (six) hours as needed. 30 tablet 0   No current facility-administered medications for this visit.    Allergies:    Allergies  Allergen Reactions  . Codeine     REACTION: headache and nausea  . Evista [Raloxifene]   . Procaine Hcl     REACTION: headaches    Social History:  The patient  reports that she quit smoking about 5 years ago. Her smoking use included Cigarettes. She has a 22.5 pack-year smoking history. She does not have any smokeless tobacco history on file. She reports that she drinks about 1.2 oz of alcohol per week. She reports that she does not use illicit drugs.  To take care of her husband.  Family History  Problem Relation Age of Onset  . Heart disease Mother   . Cancer Father     prostate  . Heart disease Father   . Cancer Maternal Grandmother     liver    ROS:  Please see the history of present illness.   No bleeding, no syncope, no orthopnea, no PND. Positive cough, palpitations. Prior right sided breast discoloration intermittently. Told this was radiation induced. Positive for cough, snoring, balance issues, bruising  All other systems reviewed and negative.   PHYSICAL EXAM: VS:  BP 102/62 mmHg  Pulse 88  Ht 5\' 3"  (1.6 m)  Wt 137 lb (62.143 kg)  BMI 24.27 kg/m2 Well nourished, well developed, in no acute distress HEENT: normal, [place]/AT, EOMI Neck: no JVD, normal carotid upstroke, no bruit Cardiac:  normal S1, S2; RRR; no murmur Lungs:  clear to auscultation bilaterally, no wheezing, rhonchi or rales Abd: soft, nontender, no hepatomegaly, no bruits Ext: no edema, 2+ distal pulses Skin: warm and dry GU: deferred Neuro: no focal abnormalities noted, AAO x 3  EKG:  Sinus rhythm with no other significant  abnormalities. Telemetry demonstrates PAC, onset of atrial tachycardia. I do not see flutter waves.  ECHO: 07/23/14: - Normal biventricular size and systolic function. Grade 2 diastolic dysfunction. Normal filling pressures. No significant valvular abnormalities.  Labs: 06/10/14-sodium 137, potassium 3.8, creatinine 0.6, troponin 0.3 , Hemoglobin 13.2, glucose 112, TSH 1.6-normal  ASSESSMENT AND PLAN:  1. Paroxysmal supraventricular tachycardia-paroxysmal atrial tachycardia-I do not believe this is atrial flutter or atrial fibrillation. This does not require anticoagulation. Cardizem  CD 120 mg once a day. Helping decrease the overall burden of PAT. Echocardiogram reassuring. TSH was normal. Avoid excessive caffeine use, she states that she does not drink caffeine. Also, with her chest tightness felt during her paroxysmal atrial tachycardia, this may be indicative of worsening CAD. There is coronary artery calcification (LAD) as well as aortic atherosclerosis seen on CT scan. If her symptoms of chest tightness return, we will proceed with nuclear stress test for further evaluation. She will let me know. I will see her back in 12 months. 2. COPD/emphysema-certainly underlying lung disease can exacerbate atrial tachycardias, usually multifocal atrial tachycardia. Continue with Cardizem.  Signed, Candee Furbish, MD [facility]  03/05/2015 3:21 PM

## 2015-03-05 NOTE — Patient Instructions (Signed)
The current medical regimen is effective;  continue present plan and medications.  Follow up in 1 year with Dr. Skains.  You will receive a letter in the mail 2 months before you are due.  Please call us when you receive this letter to schedule your follow up appointment.  Thank you for choosing [facility]!!     

## 2015-05-14 ENCOUNTER — Encounter: Payer: Self-pay | Admitting: Pulmonary Disease

## 2015-05-14 ENCOUNTER — Ambulatory Visit (INDEPENDENT_AMBULATORY_CARE_PROVIDER_SITE_OTHER): Payer: Medicare Other | Admitting: Pulmonary Disease

## 2015-05-14 VITALS — BP 126/60 | HR 74 | Temp 97.0°F | Ht 63.0 in | Wt 136.6 lb

## 2015-05-14 DIAGNOSIS — J479 Bronchiectasis, uncomplicated: Secondary | ICD-10-CM

## 2015-05-14 DIAGNOSIS — J438 Other emphysema: Secondary | ICD-10-CM

## 2015-05-14 DIAGNOSIS — R05 Cough: Secondary | ICD-10-CM

## 2015-05-14 DIAGNOSIS — R053 Chronic cough: Secondary | ICD-10-CM

## 2015-05-14 MED ORDER — TRAMADOL HCL 50 MG PO TABS: 50.0000 mg | ORAL_TABLET | Freq: Four times a day (QID) | ORAL | Status: DC | PRN

## 2015-05-14 MED ORDER — TRAMADOL HCL 50 MG PO TABS: ORAL_TABLET | ORAL | Status: DC

## 2015-05-14 NOTE — Assessment & Plan Note (Signed)
The patient continues to have significant chronic cough which sounds more upper airway in origin, but with her underlying bronchiectasis and a few episodes of pulmonary infection in the last 6 months, I cannot exclude atypical colonization/infection as a contributor. She clearly has an upper airway component when she describes a tickle and globus sensation, and she is clearing her throat during our visit. However, she feels that she has had increased rattling in her chest over the last 6 months.  She had unimpressive cultures in January of this year, but was not in the middle of an acute exacerbation. Since she is not doing as well as she had in the past, would like to repeat her CT chest.  Depending upon results, she may need bronchoscopy in order to get good culture data.

## 2015-05-14 NOTE — Progress Notes (Signed)
   Subjective:    Patient ID: Elizabeth Sparks, female    DOB: [birth year], 74 y.o.   MRN: [id number]  HPI The patient comes in today for follow-up of her known bronchiectasis, COPD, and upper airway cough syndrome probably secondary to the irritable larynx syndrome. She feels that she has not done well over the last 6 months, with increasing cough, some rattling in her chest, and also 2-3 episodes of possible pulmonary infection/pneumonia. Those x-rays and visits are not available for my review. She also has a history of irritable larynx syndrome, and was treated with gabapentin as a trial. She saw no real difference in her cough while being on this medication. He continues to have a tickle in her throat, as well as a classic globus sensation. She denies any current postnasal drip or reflux. She has stayed on her bronchodilator regimen, and has not seen any significant change in her exertional tolerance.   Review of Systems  Constitutional: Negative for fever and unexpected weight change.  HENT: Positive for congestion. Negative for dental problem, ear pain, nosebleeds, postnasal drip, rhinorrhea, sinus pressure, sneezing, sore throat and trouble swallowing.   Eyes: Negative for redness and itching.  Respiratory: Positive for cough and shortness of breath. Negative for chest tightness and wheezing.   Cardiovascular: Negative for palpitations and leg swelling.  Gastrointestinal: Negative for nausea and vomiting.  Genitourinary: Negative for dysuria.  Musculoskeletal: Negative for joint swelling.  Skin: Negative for rash.  Neurological: Negative for headaches.  Hematological: Does not bruise/bleed easily.  Psychiatric/Behavioral: Negative for dysphoric mood. The patient is not nervous/anxious.        Objective:   Physical Exam Thin female in no acute distress Nose without purulence or discharge noted Neck without lymphadenopathy or thyromegaly Chest with mild basilar crackles, no wheezes  and excellent airflow. Cardiac exam with regular rate and rhythm Lower extremities without edema, no cyanosis Alert and oriented, moves all 4 extremities.       Assessment & Plan:

## 2015-05-14 NOTE — Assessment & Plan Note (Signed)
The patient continues on her good bronchodilator regimen, and has not had a recent acute exacerbation.

## 2015-05-14 NOTE — Assessment & Plan Note (Signed)
The patient has an ongoing cough, but she is not producing any significant mucus. She does not have a lot of chest congestion, and nothing to suggest an acute exacerbation of her bronchiectasis. She does tell me that she was diagnosed with pneumonia recently, but those x-rays are not available for my review.

## 2015-05-14 NOTE — Patient Instructions (Signed)
Can use tramadol 50mg  one every 6-8 hrs if needed for cough Avoid throat clearing, try hard candy to help with tickle Take an over the counter antihistamine if you think you are having postnasal drip Will schedule for CT chest early this week in light of worsening symptoms last 49mos.  Will call you and arrange followup with Dr. Lake Bells.

## 2015-05-16 ENCOUNTER — Ambulatory Visit (INDEPENDENT_AMBULATORY_CARE_PROVIDER_SITE_OTHER)
Admission: RE | Admit: 2015-05-16 | Discharge: 2015-05-16 | Disposition: A | Discharge status: Home / Self Care | Payer: Medicare Other | Source: Ambulatory Visit | Attending: Pulmonary Disease | Admitting: Pulmonary Disease

## 2015-05-16 DIAGNOSIS — J479 Bronchiectasis, uncomplicated: Secondary | ICD-10-CM | POA: Diagnosis not present

## 2015-05-22 ENCOUNTER — Telehealth: Payer: Self-pay | Admitting: Pulmonary Disease

## 2015-05-22 NOTE — Telephone Encounter (Signed)
lmomtcb x1 

## 2015-05-22 NOTE — Telephone Encounter (Signed)
Called and discussed ct chest with pt.  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  Her main issue now is chronic cough, and I still believe this is more upper airway.  She tells me today that her cough is greatly improved with one tramadol a day.  Since she is doing better, will hold off on doing FOB for culture data.  She will stay on prn tramadol for now.   Mindy, please arrange an appt with Dr Lake Bells for this pt in 28mos. Thanks.

## 2015-05-23 NOTE — Telephone Encounter (Signed)
Called pt and appt scheduled for 8/10 at 9 AM. Nothing further needed

## 2015-06-04 ENCOUNTER — Ambulatory Visit: Payer: Medicare Other | Admitting: Pulmonary Disease

## 2015-06-24 ENCOUNTER — Telehealth: Payer: Self-pay | Admitting: Pulmonary Disease

## 2015-06-24 MED ORDER — TRAMADOL HCL 50 MG PO TABS: ORAL_TABLET | ORAL | Status: DC

## 2015-06-24 NOTE — Telephone Encounter (Signed)
Ok with me 

## 2015-06-24 NOTE — Telephone Encounter (Signed)
Pt is requesting refill on tramadol 50 mg. Last refilled 05/14/15 #40 tabs 1 every 6-8 hours as needed for cough Pt has pending appt 07/24/15 w/ Dr. Lake Bells.  Please advise MW if okay to refill as BQ is on vacation. thanks

## 2015-06-24 NOTE — Telephone Encounter (Signed)
lmomtcb x1 RX called in

## 2015-06-25 NOTE — Telephone Encounter (Signed)
Pt informed. Nothing further needed. 

## 2015-06-25 NOTE — Telephone Encounter (Signed)
Pt returned call. I told her Rx has been called in. She is going to call her pharmacy. Nothing further needed.

## 2015-07-08 ENCOUNTER — Other Ambulatory Visit: Payer: Self-pay | Admitting: Cardiology

## 2015-07-09 ENCOUNTER — Other Ambulatory Visit: Payer: Self-pay

## 2015-07-09 MED ORDER — DILTIAZEM HCL ER COATED BEADS 120 MG PO CP24: 120.0000 mg | ORAL_CAPSULE | Freq: Every day | ORAL | Status: DC

## 2015-07-11 ENCOUNTER — Other Ambulatory Visit: Payer: Self-pay | Admitting: Gastroenterology

## 2015-07-11 DIAGNOSIS — R1310 Dysphagia, unspecified: Secondary | ICD-10-CM

## 2015-07-15 ENCOUNTER — Ambulatory Visit
Admission: RE | Admit: 2015-07-15 | Discharge: 2015-07-15 | Disposition: A | Discharge status: Home / Self Care | Payer: Medicare Other | Source: Ambulatory Visit | Attending: Gastroenterology | Admitting: Gastroenterology

## 2015-07-15 DIAGNOSIS — R1310 Dysphagia, unspecified: Secondary | ICD-10-CM

## 2015-07-24 ENCOUNTER — Encounter: Payer: Self-pay | Admitting: Pulmonary Disease

## 2015-07-24 ENCOUNTER — Ambulatory Visit (INDEPENDENT_AMBULATORY_CARE_PROVIDER_SITE_OTHER): Payer: Medicare Other | Admitting: Pulmonary Disease

## 2015-07-24 VITALS — BP 148/70 | HR 66 | Ht 63.0 in | Wt 132.0 lb

## 2015-07-24 DIAGNOSIS — J479 Bronchiectasis, uncomplicated: Secondary | ICD-10-CM

## 2015-07-24 DIAGNOSIS — J438 Other emphysema: Secondary | ICD-10-CM

## 2015-07-24 DIAGNOSIS — B370 Candidal stomatitis: Secondary | ICD-10-CM | POA: Diagnosis not present

## 2015-07-24 DIAGNOSIS — R05 Cough: Secondary | ICD-10-CM

## 2015-07-24 DIAGNOSIS — R053 Chronic cough: Secondary | ICD-10-CM

## 2015-07-24 MED ORDER — TRAMADOL HCL 50 MG PO TABS: ORAL_TABLET | ORAL | Status: DC

## 2015-07-24 NOTE — Assessment & Plan Note (Signed)
Her cough has been well controlled with nightly tramadol. We will continue this. I advised her that she should not take this with narcotics.

## 2015-07-24 NOTE — Progress Notes (Signed)
Subjective:    Patient ID: Elizabeth Sparks, female    DOB: 28-Jun-1941, 74 y.o.   MRN: [id number]  Synopsis: Former patient of Dr. Gwenette Greet with bronchiectasis and COPD. Had pertussis in forth.  CT chest 2012:  +bronchiectasis esp RUL, + tree and bud c/w MAC colonization.  Cultures 12/2014:  Neg bacteria and neg AFB 05/2015 HRCT > bronchiectasis (mild, upper lobe)and mild emphysema PFT's 2010:  FEV1 1.39 (66%), ratio 63, +airtrapping, no restriction, DLCO 64% Significant smoking history Has chronic cough controlled with tramadol.  HPI Chief Complaint  Patient presents with  . Advice Only    Switch from [place] to BQ.  pt c/o sob, nonprod cough worse with certain smells and heat.     Elizabeth Sparks is here to establish care with me for bronchiectasis.  She says she has had problems with bronchiectasis for her entire life, ever since childhood.   She has been experiencing dysphagia (food, bread in particular) for many years and so she had a barium swallow by her PCP last week.  She takes alendronate.  She has significant acid reflux symptoms.  She says that her mother had a lot of pneumonia and respiratory problems.  No known CF in family.  She is not certain about a respiratory illness as child.  Elizabeth Sparks says that she has trouble breathing in the hot weather, but for the most part she has been OK. She controls her cough with tramadol once per night which works well for her. She failed gabapentin for cough this year. She was last treated for bronchitis/"pneumonia" in January.  Levaquin worked well for that. She does not produce mucus every day. Cough can be worse at night due to reflux (if she eats cornbread). Still taking symbicor tand spiriva daily.   Past Medical History  Diagnosis Date  . Arthritis   . COPD (chronic obstructive pulmonary disease)   . Hypertension   . Thyroid disease   . Wheezing   . Shortness of breath   . GERD (gastroesophageal reflux disease)   . Complication of  anesthesia     has had ilius  . Cancer     right breast  . S/P radiation therapy comp. 02/10/12    42.5 Gy / 16 Fractions to Right Breast at [facility]  . History of ongoing treatment with alendronate (Fosamax)   . Breast cancer   . Atrial fibrillation 07/17/14      Review of Systems  Constitutional: Negative for fever and unexpected weight change.  HENT: Negative for congestion, dental problem, ear pain, nosebleeds, postnasal drip, rhinorrhea, sinus pressure, sneezing, sore throat and trouble swallowing.   Eyes: Negative for redness and itching.  Respiratory: Positive for cough, chest tightness and shortness of breath. Negative for wheezing.   Cardiovascular: Negative for palpitations and leg swelling.  Gastrointestinal: Negative for nausea and vomiting.  Genitourinary: Negative for dysuria.  Musculoskeletal: Negative for joint swelling.  Skin: Negative for rash.  Neurological: Negative for headaches.  Hematological: Does not bruise/bleed easily.  Psychiatric/Behavioral: Negative for dysphoric mood. The patient is not nervous/anxious.        Objective:   Physical Exam Filed Vitals:   07/24/15 0853  BP: 148/70  Pulse: 66  Height: 5\' 3"  (1.6 m)  Weight: 132 lb (59.875 kg)  SpO2: 100%   RA  Gen: well appearing, no acute distress HENT: NCAT, OP with thrush, neck supple without masses Eyes: PERRL, EOMi Lymph: no cervical lymphadenopathy PULM: CTA B CV: RRR, slight  systolic murmur, notable JVD GI: BS+, soft, nontender, no hsm Derm: no rash or skin breakdown MSK: normal bulk and tone Neuro: A&Ox4, CN II-XII intact, strength 5/5 in all 4 extremities Psyche: normal mood and affect  CT chest images from June 2016 personally reviewed showing mild bronchiectasis in the upper lobes predominantly, mild centrilobular emphysema Previous records from earlier this year were reviewed where she was treated for bronchiectasis and chronic cough      Assessment &  Plan:  COPD (chronic obstructive pulmonary disease) with emphysema She has moderate airflow obstruction. This is been a stable interval for her. She is compliant with her medications. Her immunizations are up-to-date.  Plan: Continue Symbicort and Spiriva Use mouthwash after inhalers that she had some thrush on exam today  Chronic cough Her cough has been well controlled with nightly tramadol. We will continue this. I advised her that she should not take this with narcotics.  Bronchiectasis without acute exacerbation I believe that she has bronchiectasis because of a pertussis infection when she was in grade school. She has had chronic symptoms ever since then. That is one of the more common causes of bronchiectasis in the [place].  From all standpoints she has been doing well since the last visit however. She did have an infection in the wintertime. She does not produce mucus on a daily basis and she is compliant with her medications.  Plan: Continue bronchial dilators as noted above Continue stay active every day high protein diet was encouraged Flu shot in the fall Follow-up in 6 months  Thrush Due to inhalers, mild.  Advised mouthwash after inhalers     Current outpatient prescriptions:  .  aspirin 81 MG tablet, Take 81 mg by mouth daily. , Disp: , Rfl:  .  budesonide-formoterol (SYMBICORT) 160-4.5 MCG/ACT inhaler, Inhale 2 puffs into the lungs 2 (two) times daily.  , Disp: , Rfl:  .  calcium carbonate (OS-CAL) 600 MG TABS, Take 600 mg by mouth daily., Disp: , Rfl:  .  Cholecalciferol (VITAMIN D-3 PO), Take by mouth daily.  , Disp: , Rfl:  .  diltiazem (CARDIZEM CD) 120 MG 24 hr capsule, Take 1 capsule (120 mg total) by mouth daily., Disp: 90 capsule, Rfl: 3 .  esomeprazole (NEXIUM) 20 MG capsule, Take 20 mg by mouth daily at 12 noon. , Disp: , Rfl:  .  hydrochlorothiazide (HYDRODIURIL) 25 MG tablet, Take 25 mg by mouth daily.  , Disp: , Rfl:  .  KLOR-CON 10 10 MEQ  tablet, Take 10 mEq by mouth daily. , Disp: , Rfl:  .  losartan (COZAAR) 100 MG tablet, Take 100 mg by mouth daily., Disp: , Rfl:  .  simvastatin (ZOCOR) 10 MG tablet, Take 10 mg by mouth at bedtime.  , Disp: , Rfl:  .  tiotropium (SPIRIVA) 18 MCG inhalation capsule, Place 18 mcg into inhaler and inhale daily.  , Disp: , Rfl:  .  traMADol (ULTRAM) 50 MG tablet, 1 every 6-8 hours as needed for cough, Disp: 40 tablet, Rfl: 0

## 2015-07-24 NOTE — Patient Instructions (Signed)
Keep taking your medications as you're doing Use a mouthwash after you use your inhalers Stay active, exercise daily Get a flu shot in the fall We will see you back in 6 months or sooner if needed

## 2015-07-24 NOTE — Assessment & Plan Note (Signed)
Due to inhalers, mild.  Advised mouthwash after inhalers

## 2015-07-24 NOTE — Assessment & Plan Note (Signed)
She has moderate airflow obstruction. This is been a stable interval for her. She is compliant with her medications. Her immunizations are up-to-date.  Plan: Continue Symbicort and Spiriva Use mouthwash after inhalers that she had some thrush on exam today

## 2015-07-24 NOTE — Assessment & Plan Note (Signed)
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 She has had chronic symptoms ever since then. That is one of the more common causes of bronchiectasis in the [place].  From all standpoints she has been doing well since the last visit however. She did have an infection in the wintertime. She does not produce mucus on a daily basis and she is compliant with her medications.  Plan: Continue bronchial dilators as noted above Continue stay active every day high protein diet was encouraged Flu shot in the fall Follow-up in 6 months

## 2015-08-09 ENCOUNTER — Encounter: Payer: Self-pay | Admitting: Radiation Oncology

## 2015-08-09 ENCOUNTER — Ambulatory Visit
Admission: RE | Admit: 2015-08-09 | Discharge: 2015-08-09 | Disposition: A | Discharge status: Home / Self Care | Payer: Medicare Other | Source: Ambulatory Visit | Attending: Radiation Oncology | Admitting: Radiation Oncology

## 2015-08-09 VITALS — BP 140/42 | HR 70 | Temp 97.6°F | Resp 20 | Ht 63.0 in | Wt 132.4 lb

## 2015-08-09 DIAGNOSIS — C50111 Malignant neoplasm of central portion of right female breast: Secondary | ICD-10-CM

## 2015-08-09 NOTE — Progress Notes (Signed)
Radiation Oncology         [phone] ________________________________  Name: CHANTEL TETI MRN: [id number]  Date: 08/09/2015  DOB: 1941-11-06  Follow-Up Visit Note  Outpatient  CC: Kandice Hams, MD  Carlena Sax, MD   ICD-9-CM ICD-10-CM   1. Cancer of central portion of female breast, right 174.1 C50.111     Diagnosis and Prior Radiotherapy:   [id number] right breast invasive ductal carcinoma, grade 2, ER/PR positive, HER-2/neu negative  She received 42.56 Gray in 16 fractions completed 02/10/2012 to her right breast in [place], [place]   Narrative:  The patient returns today for routine follow-up. She denies pain. She reports she has lost 15 lbs since January due to pneumonia and a bad throat infection. 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 She is due for a mammogram in October. She reports her energy level is good. The skin on her right breast is intact.  This summer her right breast was "purple" and warm all of a sudden but responded to antibiotics.  ALLERGIES:  is allergic to codeine; evista; and procaine hcl.  Meds: Current Outpatient Prescriptions  Medication Sig Dispense Refill  . alendronate (FOSAMAX) 70 MG tablet   9  . aspirin 81 MG tablet Take 81 mg by mouth daily.     . budesonide-formoterol (SYMBICORT) 160-4.5 MCG/ACT inhaler Inhale 2 puffs into the lungs 2 (two) times daily.      . calcium carbonate (OS-CAL) 600 MG TABS Take 600 mg by mouth daily.    . Cholecalciferol (VITAMIN D-3 PO) Take by mouth daily.      Marland Kitchen diltiazem (CARDIZEM CD) 120 MG 24 hr capsule Take 1 capsule (120 mg total) by mouth daily. 90 capsule 3  . esomeprazole (NEXIUM) 20 MG capsule Take 20 mg by mouth daily at 12 noon.     . hydrochlorothiazide (HYDRODIURIL) 25 MG tablet Take 25 mg by mouth daily.      Marland Kitchen KLOR-CON 10 10 MEQ tablet Take 10 mEq by mouth daily.     Marland Kitchen losartan (COZAAR) 100 MG tablet Take 100 mg by mouth daily.    Marland Kitchen PROAIR  HFA 108 (90 BASE) MCG/ACT inhaler   4  . simvastatin (ZOCOR) 10 MG tablet Take 10 mg by mouth at bedtime.      Marland Kitchen tiotropium (SPIRIVA) 18 MCG inhalation capsule Place 18 mcg into inhaler and inhale daily.      . traMADol (ULTRAM) 50 MG tablet 1 every 6-8 hours as needed for cough 40 tablet 0   No current facility-administered medications for this encounter.    Physical Findings: The patient is in no acute distress. Patient is alert and oriented.  height is _0  (1.6 m) and weight is 132 lb 6.4 oz (60.056 kg). Her oral temperature is 97.6 F (36.4 C). Her blood pressure is 140/42 and her pulse is 70. Her respiration is 20.  No palpable lesions of concern in either breast or axillae. Skin unremarkable.  Neck without adenopathy. Lungs clear. Heart RRR   Lab Findings: Lab Results  Component Value Date   WBC 7.5 06/10/2014   HGB 13.2 06/10/2014   HCT 39.0 06/10/2014   MCV 83.9 06/10/2014   PLT 237 06/10/2014    Radiographic Findings:  No results found.  Impression/Plan: NED   I encouraged her to continue with yearly mammography and followup with me in 1 year.  This document serves as a record of services  personally performed by Eppie Gibson, MD. It was created on her behalf by Darcus Austin, a trained medical scribe. The creation of this record is based on the scribe's personal observations and the provider's statements to them. This document has been checked and approved by the attending provider.      _____________________________________   Eppie Gibson, MD

## 2015-08-09 NOTE — Progress Notes (Signed)
Elizabeth Sparks here for follow up.  She denies pain.  She reports she has lost 15 lbs since January due to pneumonia and a bad throat infection.  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  She is due for a mammogram in October.  She reports her energy level is good.  The skin on her right breast is intact.  BP 140/42 mmHg  Pulse 70  Temp(Src) 97.6 F (36.4 C) (Oral)  Resp 20  Ht 5\' 3"  (1.6 m)  Wt 132 lb 6.4 oz (60.056 kg)  BMI 23.46 kg/m2   Wt Readings from Last 3 Encounters:  08/09/15 132 lb 6.4 oz (60.056 kg)  07/24/15 132 lb (59.875 kg)  05/14/15 136 lb 9.6 oz (61.961 kg)

## 2015-09-10 ENCOUNTER — Other Ambulatory Visit: Payer: Self-pay

## 2015-09-10 ENCOUNTER — Other Ambulatory Visit: Payer: Self-pay | Admitting: Radiation Oncology

## 2015-09-10 DIAGNOSIS — Z9889 Other specified postprocedural states: Secondary | ICD-10-CM

## 2015-09-10 DIAGNOSIS — Z853 Personal history of malignant neoplasm of breast: Secondary | ICD-10-CM

## 2015-09-12 ENCOUNTER — Telehealth: Payer: Self-pay | Admitting: Pulmonary Disease

## 2015-09-12 MED ORDER — TRAMADOL HCL 50 MG PO TABS: ORAL_TABLET | ORAL | Status: DC

## 2015-09-12 NOTE — Telephone Encounter (Signed)
Per BQ, ok to refill.  Thanks!

## 2015-09-12 NOTE — Telephone Encounter (Signed)
Spoke with pt. She reports she is still having a dry cough. She wants a refill on the tramadol 50 mg tabs Last refilled 07/24/15 #40 x 0 refills 1 every 6-8 hours as needed for cough  Please advise Dr. Lake Bells thanks

## 2015-09-12 NOTE — Telephone Encounter (Signed)
Rx has been called in. Pt is aware. Nothing further was needed. 

## 2015-10-14 ENCOUNTER — Ambulatory Visit
Admission: RE | Admit: 2015-10-14 | Discharge: 2015-10-14 | Disposition: A | Discharge status: Home / Self Care | Payer: Medicare Other | Source: Ambulatory Visit

## 2015-10-14 DIAGNOSIS — Z9889 Other specified postprocedural states: Secondary | ICD-10-CM

## 2015-10-14 DIAGNOSIS — Z853 Personal history of malignant neoplasm of breast: Secondary | ICD-10-CM

## 2015-10-18 ENCOUNTER — Other Ambulatory Visit: Payer: Self-pay | Admitting: Pulmonary Disease

## 2015-10-18 NOTE — Telephone Encounter (Signed)
Dr. Lake Bells, pt is requesting a refill on Tramadol. Last refilled 09/12/15 #40, 1 tab po q6-8h prn cough, 0 refills. Last office visit: 07/24/15 Next office visit: 01/21/16  Please advise on refill.  Thanks!

## 2015-10-21 ENCOUNTER — Telehealth: Payer: Self-pay | Admitting: Pulmonary Disease

## 2015-10-21 NOTE — Telephone Encounter (Addendum)
Spoke with pt. She is needing a refill on Tramadol. Advised her that we would have to check with BQ about this.  BQ - please advise on refill. Thanks.

## 2015-10-21 NOTE — Telephone Encounter (Signed)
OK by me refill #50 with 1 refill, make sure she has a f/u appointment

## 2015-10-22 MED ORDER — TRAMADOL HCL 50 MG PO TABS: ORAL_TABLET | ORAL | Status: DC

## 2015-10-22 NOTE — Telephone Encounter (Signed)
Pt aware that RX has been called into [facility] Upcoming appt with BQ 01/21/16 Nothing further needed.

## 2015-11-01 IMAGING — CR DG CHEST 2V
2 series · 2 of 2 positions shown · non-contrast
Comparison: 11/19/2011

CLINICAL DATA: Cough.  Shortness of breath.  COPD and hypertension.

EXAM:
CHEST  2 VIEW

[view not recorded (1 of 2)]
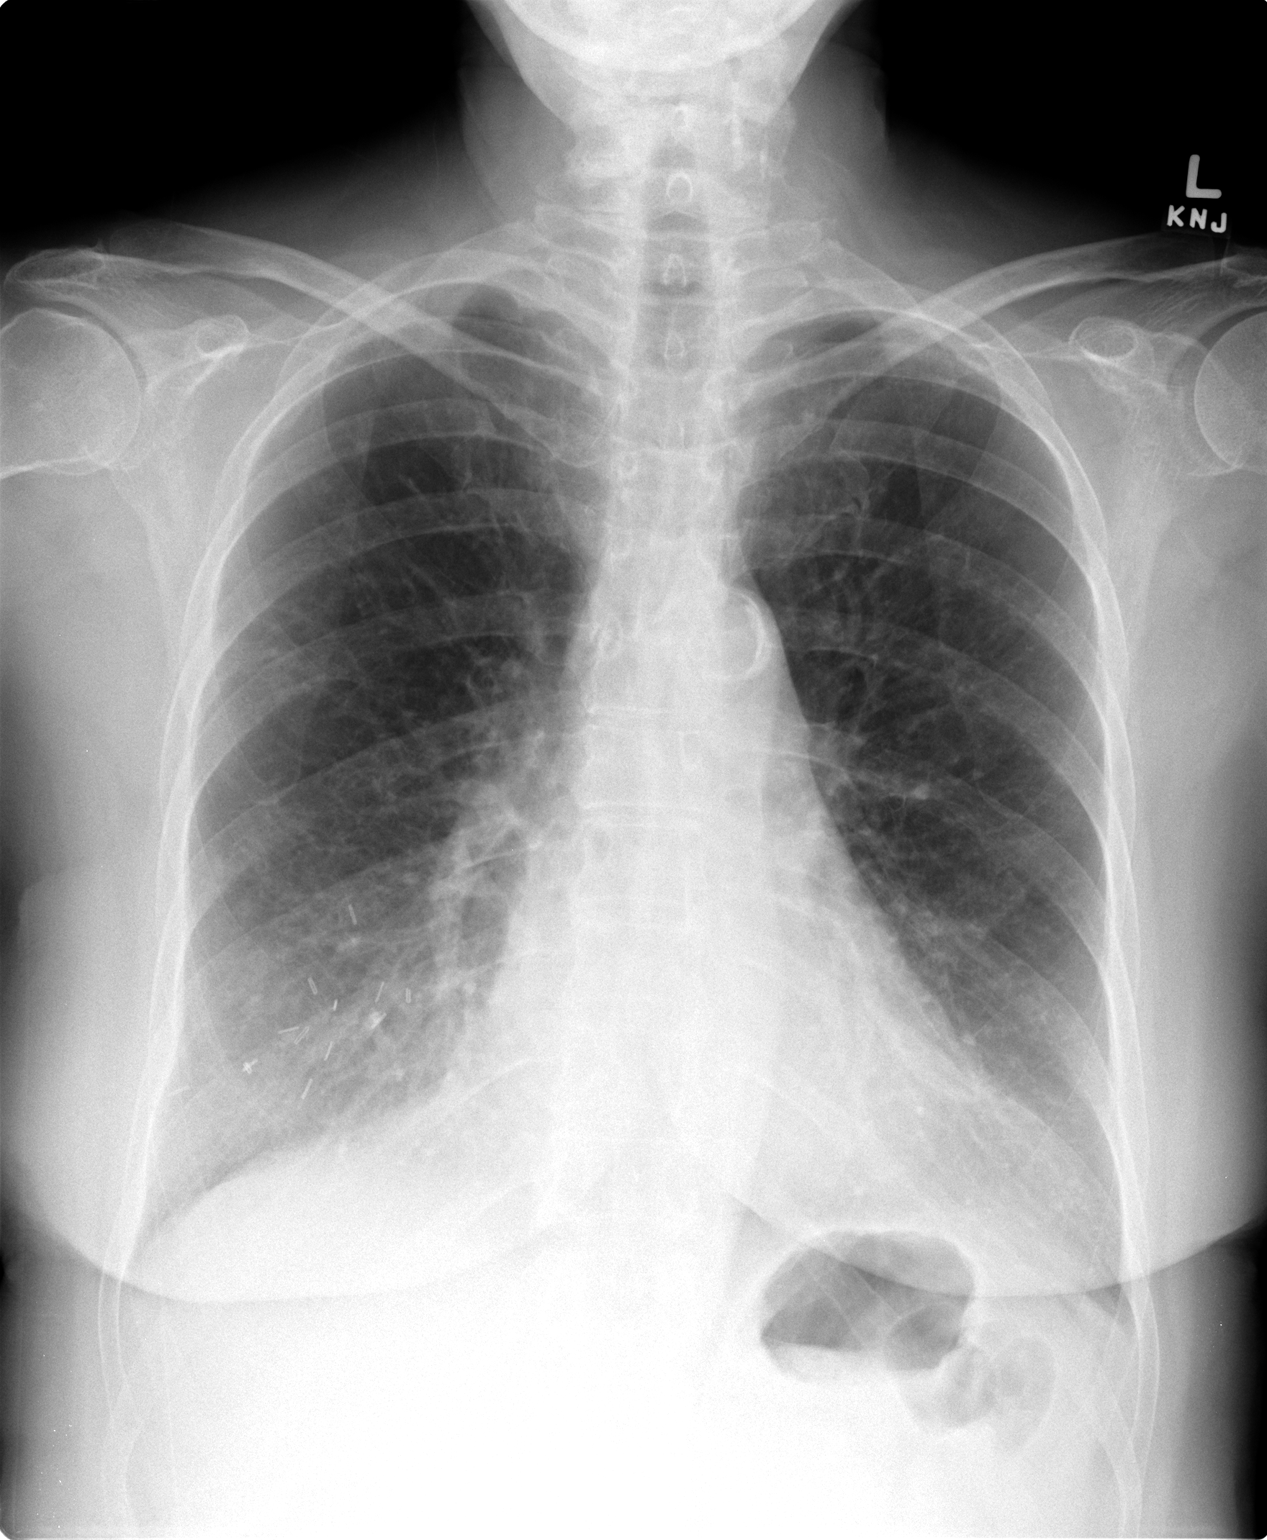

[view not recorded (2 of 2)]
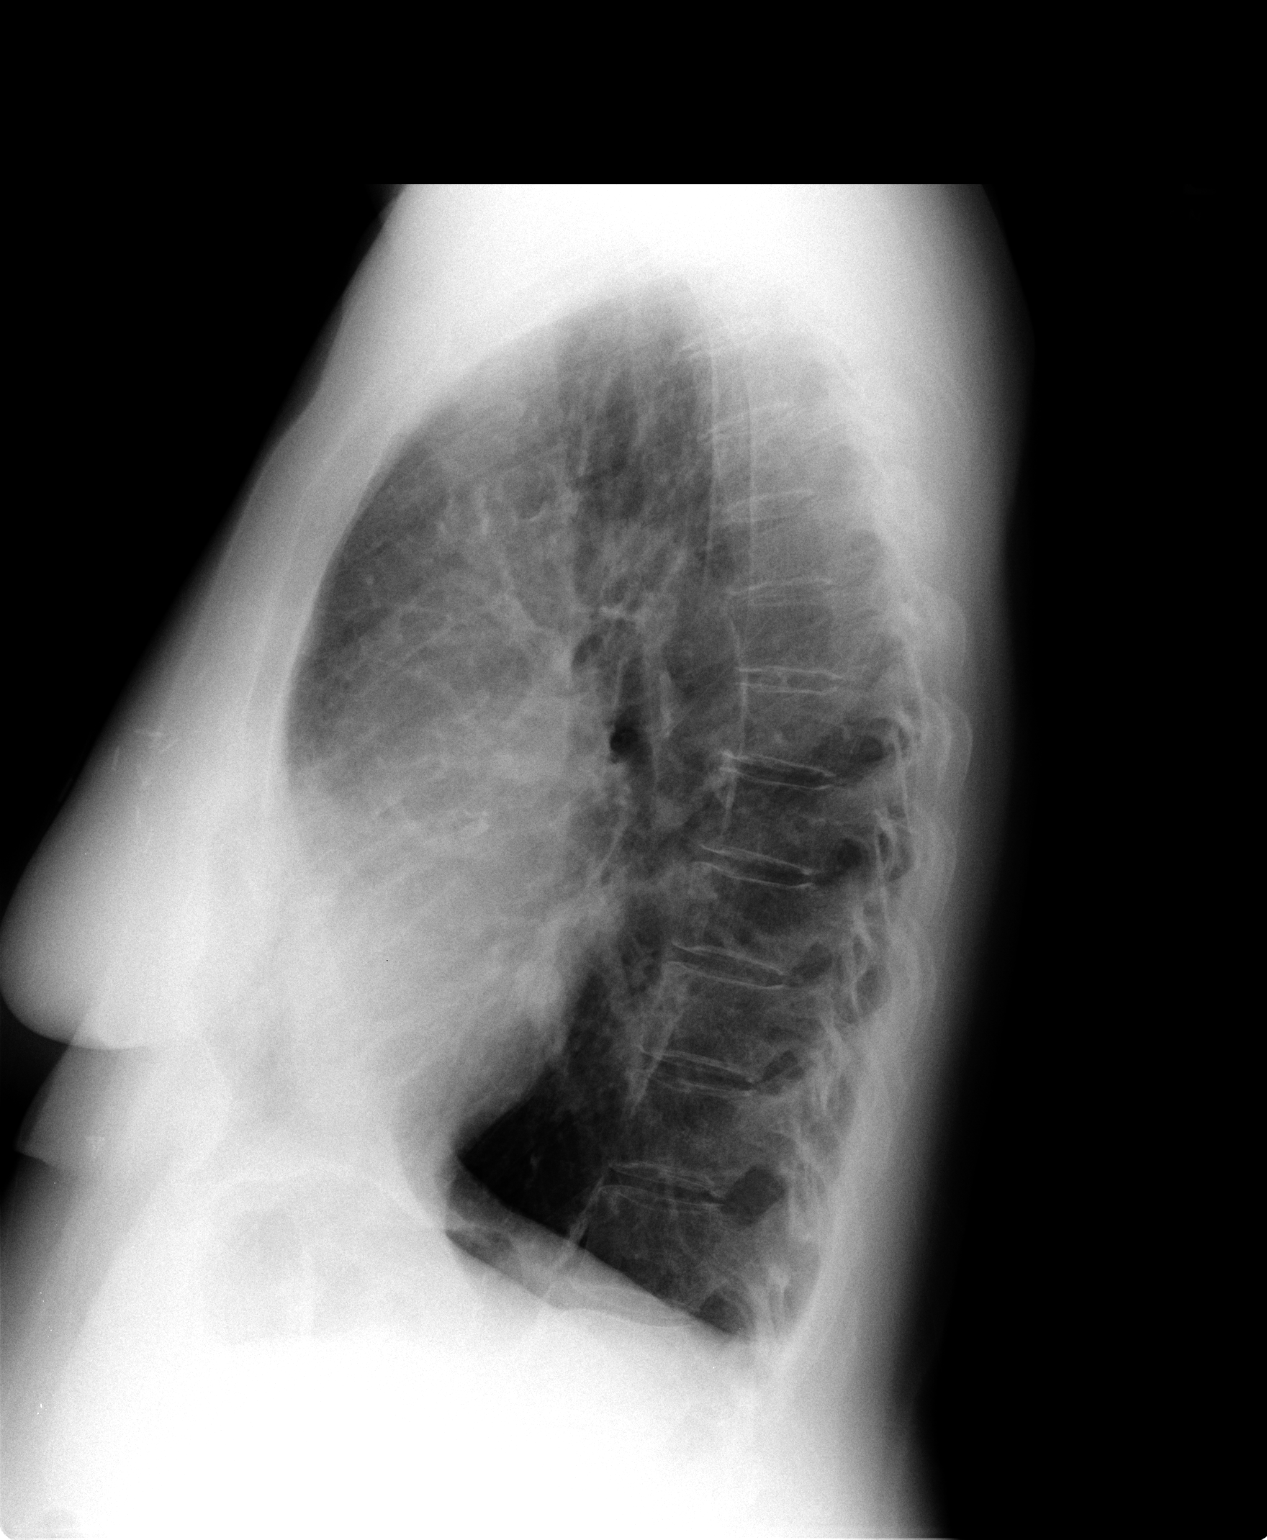

[2 of 2 positions shown; findings below may reference images not displayed]

FINDINGS: Emphysema noted. Atherosclerotic aortic arch. New clips project of
the right breast.

The lungs appear otherwise clear.
IMPRESSION: 1. Emphysema and atherosclerosis.  No acute findings.
2. New clips project over the right breast.

## 2015-11-22 ENCOUNTER — Encounter: Payer: Self-pay | Admitting: Adult Health

## 2015-11-22 ENCOUNTER — Ambulatory Visit (INDEPENDENT_AMBULATORY_CARE_PROVIDER_SITE_OTHER): Payer: Medicare Other | Admitting: Adult Health

## 2015-11-22 VITALS — BP 116/60 | HR 98 | Ht 63.75 in | Wt 131.0 lb

## 2015-11-22 DIAGNOSIS — J471 Bronchiectasis with (acute) exacerbation: Secondary | ICD-10-CM | POA: Diagnosis not present

## 2015-11-22 MED ORDER — AMOXICILLIN-POT CLAVULANATE 875-125 MG PO TABS: 1.0000 | ORAL_TABLET | Freq: Two times a day (BID) | ORAL | Status: AC

## 2015-11-22 NOTE — Patient Instructions (Signed)
Begin Augmentin 875 mg twice daily for 7 days, take with food Mucinex twice daily as needed for cough and congestion Fluids and rest Follow up Dr. Lake Bells in 3 months and As needed   Please contact office for sooner follow up if symptoms do not improve or worsen or seek emergency care

## 2015-11-22 NOTE — Progress Notes (Signed)
Reviewed, I agree with this plan of care 

## 2015-11-22 NOTE — Progress Notes (Signed)
Subjective:    Patient ID: Elizabeth Sparks, female    DOB: [birth year], 74 y.o.   MRN: [id number]  HPI 74 year old female former smoker  with bronchiectasis and COPD    TEST  CT chest 2012: +bronchiectasis esp RUL, + tree and bud c/w MAC colonization.  Cultures 12/2014: Neg bacteria and neg AFB 05/2015 HRCT > bronchiectasis (mild, upper lobe)and mild emphysema PFT's 2010: FEV1 1.39 (66%), ratio 63, +airtrapping, no restriction, DLCO 64%   11/22/2015 Acute office visit Patient presents for an acute office visit. Complains of  increased SOB, coughing with production of "taupe" mucus, chest tighntess and wheezing. Onset was 3 -5 days ago. Believes she has been running a low grade fever. Denies hemoptysis , chest pain , orthopnea or edema.  Remains on Symbicort and Spiriva.   Past Medical History  Diagnosis Date  . Arthritis   . COPD (chronic obstructive pulmonary disease) ([place])   . Hypertension   . Thyroid disease   . Wheezing   . Shortness of breath   . GERD (gastroesophageal reflux disease)   . Complication of anesthesia     has had ilius  . Cancer ([place])     right breast  . S/P radiation therapy comp. 02/10/12    42.5 Gy / 16 Fractions to Right Breast at [facility]  . History of ongoing treatment with alendronate (Fosamax)   . Breast cancer ([place])   . Atrial fibrillation ([place]) 07/17/14   Current Outpatient Prescriptions on File Prior to Visit  Medication Sig Dispense Refill  . alendronate (FOSAMAX) 70 MG tablet Take 70 mg by mouth once a week.   9  . aspirin 81 MG tablet Take 81 mg by mouth daily.     . budesonide-formoterol (SYMBICORT) 160-4.5 MCG/ACT inhaler Inhale 2 puffs into the lungs 2 (two) times daily.      . calcium carbonate (OS-CAL) 600 MG TABS Take 600 mg by mouth daily.    . Cholecalciferol (VITAMIN D-3 PO) Take by mouth daily.      Marland Kitchen diltiazem (CARDIZEM CD) 120 MG 24 hr capsule Take 1 capsule (120 mg total) by mouth daily. 90 capsule 3    . esomeprazole (NEXIUM) 20 MG capsule Take 20 mg by mouth daily at 12 noon.     . hydrochlorothiazide (HYDRODIURIL) 25 MG tablet Take 25 mg by mouth daily.      Marland Kitchen KLOR-CON 10 10 MEQ tablet Take 10 mEq by mouth daily.     Marland Kitchen losartan (COZAAR) 100 MG tablet Take 100 mg by mouth daily.    Marland Kitchen PROAIR HFA 108 (90 BASE) MCG/ACT inhaler 2 puffs every 4 (four) hours as needed.   4  . simvastatin (ZOCOR) 10 MG tablet Take 10 mg by mouth at bedtime.      Marland Kitchen tiotropium (SPIRIVA) 18 MCG inhalation capsule Place 18 mcg into inhaler and inhale daily.      . traMADol (ULTRAM) 50 MG tablet 1 every 6-8 hours as needed for cough 50 tablet 1   No current facility-administered medications on file prior to visit.     Review of Systems Constitutional:   No  weight loss, night sweats,  , chills, fatigue, or  lassitude.  HEENT:   No headaches,  Difficulty swallowing,  Tooth/dental problems, or  Sore throat,                No sneezing, itching, ear ache, + nasal congestion, post nasal drip,   CV:  No chest pain,  Orthopnea, PND, swelling in lower extremities, anasarca, dizziness, palpitations, syncope.   GI  No heartburn, indigestion, abdominal pain, nausea, vomiting, diarrhea, change in bowel habits, loss of appetite, bloody stools.   Resp:  No chest wall deformity  Skin: no rash or lesions.  GU: no dysuria, change in color of urine, no urgency or frequency.  No flank pain, no hematuria   MS:  No joint pain or swelling.  No decreased range of motion.  No back pain.  Psych:  No change in mood or affect. No depression or anxiety.  No memory loss.         Objective:   Physical Exam GEN: A/Ox3; pleasant , NAD, elderly   HEENT:  [place]/AT,  EACs-clear, TMs-wnl, NOSE-clear, THROAT-clear, no lesions, no postnasal drip or exudate noted.   NECK:  Supple w/ fair ROM; no JVD; normal carotid impulses w/o bruits; no thyromegaly or nodules palpated; no lymphadenopathy.  RESP  Decreased BS in bases  w/o, wheezes/  rales/ or rhonchi.no accessory muscle use, no dullness to percussion  CARD:  RRR, no m/r/g  , no peripheral edema, pulses intact, no cyanosis or clubbing.  GI:   Soft & nt; nml bowel sounds; no organomegaly or masses detected.  Musco: Warm bil, no deformities or joint swelling noted.   Neuro: alert, no focal deficits noted.    Skin: Warm, no lesions or rashes         Assessment & Plan:

## 2015-11-22 NOTE — Assessment & Plan Note (Signed)
Flare   Plan  Begin Augmentin 875 mg twice daily for 7 days, take with food Mucinex twice daily as needed for cough and congestion Fluids and rest Follow up Dr. Lake Bells in 3 months and As needed   Please contact office for sooner follow up if symptoms do not improve or worsen or seek emergency care

## 2016-01-21 ENCOUNTER — Encounter: Payer: Self-pay | Admitting: Pulmonary Disease

## 2016-01-21 ENCOUNTER — Ambulatory Visit (INDEPENDENT_AMBULATORY_CARE_PROVIDER_SITE_OTHER): Payer: Medicare Other | Admitting: Pulmonary Disease

## 2016-01-21 VITALS — BP 116/64 | HR 80 | Ht 63.0 in | Wt 129.2 lb

## 2016-01-21 DIAGNOSIS — J479 Bronchiectasis, uncomplicated: Secondary | ICD-10-CM | POA: Diagnosis not present

## 2016-01-21 DIAGNOSIS — R05 Cough: Secondary | ICD-10-CM | POA: Diagnosis not present

## 2016-01-21 DIAGNOSIS — J438 Other emphysema: Secondary | ICD-10-CM

## 2016-01-21 DIAGNOSIS — R053 Chronic cough: Secondary | ICD-10-CM

## 2016-01-21 MED ORDER — TRAMADOL HCL 50 MG PO TABS: ORAL_TABLET | ORAL | Status: DC

## 2016-01-21 NOTE — Assessment & Plan Note (Signed)
Cough at baseline today. She would like refill on her tramadol.  Plan: Tramadol refilled

## 2016-01-21 NOTE — Assessment & Plan Note (Signed)
Moderate airflow obstruction. Doing well today with no wheezes on exam.  Plan: Continue inhalers as above

## 2016-01-21 NOTE — Assessment & Plan Note (Signed)
Infection cleared with Augmentin in December. Doing well overall. No dyspnea today and cough at baseline with minimal mucous production.  Plan:  Continue Symbicort twice a day, Spiriva daily, albuterol prn Follow up with Dr. Lake Bells in 4 months or sooner

## 2016-01-21 NOTE — Progress Notes (Signed)
Subjective:    Patient ID: Elizabeth Sparks, female    DOB: 1941/07/10, 75 y.o.   MRN: [id number]  Synopsis: Former patient of Dr. Gwenette Greet with bronchiectasis and COPD. Had pertussis in fourth grade.  CT chest 2012:  +bronchiectasis esp RUL, + tree and bud c/w MAC colonization.  Cultures 12/2014:  Neg bacteria and neg AFB 05/2015 HRCT > bronchiectasis (mild, upper lobe)and mild emphysema PFT's 2010:  FEV1 1.39 (66%), ratio 63, +airtrapping, no restriction, DLCO 64% Significant smoking history Has chronic cough controlled with tramadol.  HPI Chief Complaint  Patient presents with  . Follow-up    pt doing well- notes some hoarseness, clearing of throat.  denies cough, mucus production.     Elizabeth Sparks is here to follow up on her bronchiectasis. She was last treated for bronchitis in December. Cleared up with Augmentin. She feels better overall. Denies fevers, chills, night sweats, dyspnea or chest pain. She has occasional cough at baseline that is seldom productive. She is also hoarse at baseline. Occasional nasal congestion but no rhinorrhea or post nasal drip. She is taking Symbicort twice a day and Spiriva daily. She has not had to use her albuterol for over 2 months.   Past Medical History  Diagnosis Date  . Arthritis   . COPD (chronic obstructive pulmonary disease) ([place])   . Hypertension   . Thyroid disease   . Wheezing   . Shortness of breath   . GERD (gastroesophageal reflux disease)   . Complication of anesthesia     has had ilius  . Cancer ([place])     right breast  . S/P radiation therapy comp. 02/10/12    42.5 Gy / 16 Fractions to Right Breast at [facility]  . History of ongoing treatment with alendronate (Fosamax)   . Breast cancer ([place])   . Atrial fibrillation ([place]) 07/17/14    Review of Systems  Constitutional: Negative for fever and unexpected weight change.  HENT: Positive for congestion. Negative for dental problem, ear pain, nosebleeds,  postnasal drip, rhinorrhea, sinus pressure, sneezing, sore throat and trouble swallowing.   Eyes: Negative for redness and itching.  Respiratory: Positive for cough. Negative for chest tightness, shortness of breath and wheezing.   Cardiovascular: Negative for palpitations and leg swelling.  Gastrointestinal: Negative for nausea and vomiting.  Genitourinary: Negative for dysuria.  Musculoskeletal: Negative for joint swelling.  Skin: Negative for rash.  Neurological: Negative for headaches.  Hematological: Does not bruise/bleed easily.  Psychiatric/Behavioral: Negative for dysphoric mood. The patient is not nervous/anxious.       Objective:   Physical Exam Filed Vitals:   01/21/16 1321  BP: 116/64  Pulse: 80  Height: 5\' 3"  (1.6 m)  Weight: 129 lb 3.2 oz (58.605 kg)  SpO2: 99%  RA  Gen: well appearing, no acute distress HENT: NCAT, OP clear, neck supple without masses Eyes: PERRL, EOMi Lymph: no cervical lymphadenopathy PULM: CTA B CV: RRR, slight systolic murmur, notable JVD GI: BS+, soft, nontender, no hsm Derm: no rash or skin breakdown MSK: normal bulk and tone Neuro: A&Ox4, CN II-XII intact, strength 5/5 in all 4 extremities Psyche: normal mood and affect    Assessment & Plan:  Bronchiectasis without acute exacerbation Infection cleared with Augmentin in December. Doing well overall. No dyspnea today and cough at baseline with minimal mucous production.  Plan:  Continue Symbicort twice a day, Spiriva daily, albuterol prn Follow up with Dr. Lake Bells in 4 months or sooner   COPD (chronic  obstructive pulmonary disease) with emphysema Moderate airflow obstruction. Doing well today with no wheezes on exam.  Plan: Continue inhalers as above  Chronic cough Cough at baseline today. She would like refill on her tramadol.  Plan: Tramadol refilled  Jacques Earthly, MD  Internal Medicine PGY-2 1:57 PM 01/21/2016    Attending:  I have seen and examined the patient  with nurse practitioner/resident and agree with the note above.  We formulated the plan together and I elicited the following history.    This has been a stable interval for Elizabeth Sparks. She remains compliant with her medications and she has had a much better wintertime this year than in years prior. On exam today her lungs are clear to auscultation  Agree with current plan above. She takes 1 tramadol at night which controls her cough while she sleeps. I will plan on seeing her back in 4 months  Roselie Awkward, MD [facility] Pager: [phone] Cell: [phone] After 3pm or if no response, call [phone]     Current outpatient prescriptions:  .  alendronate (FOSAMAX) 70 MG tablet, Take 70 mg by mouth once a week. , Disp: , Rfl: 9 .  aspirin 81 MG tablet, Take 81 mg by mouth daily. , Disp: , Rfl:  .  budesonide-formoterol (SYMBICORT) 160-4.5 MCG/ACT inhaler, Inhale 2 puffs into the lungs 2 (two) times daily.  , Disp: , Rfl:  .  calcium carbonate (OS-CAL) 600 MG TABS, Take 600 mg by mouth daily., Disp: , Rfl:  .  Cholecalciferol (VITAMIN D-3 PO), Take by mouth daily.  , Disp: , Rfl:  .  diltiazem (CARDIZEM CD) 120 MG 24 hr capsule, Take 1 capsule (120 mg total) by mouth daily., Disp: 90 capsule, Rfl: 3 .  esomeprazole (NEXIUM) 20 MG capsule, Take 20 mg by mouth daily at 12 noon. , Disp: , Rfl:  .  hydrochlorothiazide (HYDRODIURIL) 25 MG tablet, Take 25 mg by mouth daily.  , Disp: , Rfl:  .  KLOR-CON 10 10 MEQ tablet, Take 10 mEq by mouth daily. , Disp: , Rfl:  .  losartan (COZAAR) 100 MG tablet, Take 100 mg by mouth daily., Disp: , Rfl:  .  PROAIR HFA 108 (90 BASE) MCG/ACT inhaler, 2 puffs every 4 (four) hours as needed. , Disp: , Rfl: 4 .  simvastatin (ZOCOR) 10 MG tablet, Take 10 mg by mouth at bedtime.  , Disp: , Rfl:  .  tiotropium (SPIRIVA) 18 MCG inhalation capsule, Place 18 mcg into inhaler and inhale daily.  , Disp: , Rfl:  .  traMADol (ULTRAM) 50 MG tablet, 1 every 6-8 hours as needed  for cough, Disp: 50 tablet, Rfl: 1

## 2016-01-21 NOTE — Patient Instructions (Addendum)
Keep taking your medications as you're doing  Use a mouthwash after you use your inhalers Get a flu shot in the fall Follow up with Dr. Lake Bells in 4 months or sooner if needed

## 2016-04-08 DIAGNOSIS — R350 Frequency of micturition: Secondary | ICD-10-CM | POA: Diagnosis not present

## 2016-04-08 DIAGNOSIS — N390 Urinary tract infection, site not specified: Secondary | ICD-10-CM | POA: Diagnosis not present

## 2016-04-15 DIAGNOSIS — R112 Nausea with vomiting, unspecified: Secondary | ICD-10-CM | POA: Diagnosis not present

## 2016-04-15 DIAGNOSIS — N3000 Acute cystitis without hematuria: Secondary | ICD-10-CM | POA: Diagnosis not present

## 2016-04-21 DIAGNOSIS — I10 Essential (primary) hypertension: Secondary | ICD-10-CM | POA: Diagnosis not present

## 2016-04-21 DIAGNOSIS — C50411 Malignant neoplasm of upper-outer quadrant of right female breast: Secondary | ICD-10-CM | POA: Diagnosis not present

## 2016-04-21 DIAGNOSIS — J449 Chronic obstructive pulmonary disease, unspecified: Secondary | ICD-10-CM | POA: Diagnosis not present

## 2016-04-23 DIAGNOSIS — N390 Urinary tract infection, site not specified: Secondary | ICD-10-CM | POA: Diagnosis not present

## 2016-04-27 DIAGNOSIS — N390 Urinary tract infection, site not specified: Secondary | ICD-10-CM | POA: Diagnosis not present

## 2016-05-06 ENCOUNTER — Telehealth: Payer: Self-pay | Admitting: Pulmonary Disease

## 2016-05-06 NOTE — Telephone Encounter (Signed)
Last ov on 01/21/16 with BQ Next ov on 05/25/16 with BQ Patient Instructions       Keep taking your medications as you're doing   Use a mouthwash after you use your inhalers Get a flu shot in the fall Follow up with Dr. Lake Bells in 4 months or sooner if needed   Received a refill request from [facility] for  Tramadol HCL 50mg  Take 1 tablet by mouth every 6-8 hours as needed for cough Last filled 03/20/16 #50 tab with 1 refill  BQ please advise if okay to refill

## 2016-05-07 MED ORDER — TRAMADOL HCL 50 MG PO TABS: ORAL_TABLET | ORAL | Status: DC

## 2016-05-07 NOTE — Telephone Encounter (Signed)
OK to refill

## 2016-05-07 NOTE — Telephone Encounter (Signed)
Rx has been called in. Nothing further was needed. 

## 2016-05-25 ENCOUNTER — Ambulatory Visit: Payer: Medicare Other | Admitting: Pulmonary Disease

## 2016-05-25 DIAGNOSIS — M47816 Spondylosis without myelopathy or radiculopathy, lumbar region: Secondary | ICD-10-CM | POA: Diagnosis not present

## 2016-05-25 DIAGNOSIS — M9903 Segmental and somatic dysfunction of lumbar region: Secondary | ICD-10-CM | POA: Diagnosis not present

## 2016-05-25 DIAGNOSIS — M5441 Lumbago with sciatica, right side: Secondary | ICD-10-CM | POA: Diagnosis not present

## 2016-05-27 DIAGNOSIS — M9903 Segmental and somatic dysfunction of lumbar region: Secondary | ICD-10-CM | POA: Diagnosis not present

## 2016-05-27 DIAGNOSIS — M47816 Spondylosis without myelopathy or radiculopathy, lumbar region: Secondary | ICD-10-CM | POA: Diagnosis not present

## 2016-05-27 DIAGNOSIS — M5441 Lumbago with sciatica, right side: Secondary | ICD-10-CM | POA: Diagnosis not present

## 2016-05-29 DIAGNOSIS — M47816 Spondylosis without myelopathy or radiculopathy, lumbar region: Secondary | ICD-10-CM | POA: Diagnosis not present

## 2016-05-29 DIAGNOSIS — M9903 Segmental and somatic dysfunction of lumbar region: Secondary | ICD-10-CM | POA: Diagnosis not present

## 2016-05-29 DIAGNOSIS — M5441 Lumbago with sciatica, right side: Secondary | ICD-10-CM | POA: Diagnosis not present

## 2016-06-02 DIAGNOSIS — M5441 Lumbago with sciatica, right side: Secondary | ICD-10-CM | POA: Diagnosis not present

## 2016-06-02 DIAGNOSIS — M9903 Segmental and somatic dysfunction of lumbar region: Secondary | ICD-10-CM | POA: Diagnosis not present

## 2016-06-02 DIAGNOSIS — M47816 Spondylosis without myelopathy or radiculopathy, lumbar region: Secondary | ICD-10-CM | POA: Diagnosis not present

## 2016-06-04 ENCOUNTER — Emergency Department (HOSPITAL_COMMUNITY)
Admission: EM | Admit: 2016-06-04 | Discharge: 2016-06-05 | Disposition: A | Discharge status: Home / Self Care | Payer: Medicare Other | Attending: Emergency Medicine | Admitting: Emergency Medicine

## 2016-06-04 ENCOUNTER — Encounter (HOSPITAL_COMMUNITY): Payer: Self-pay | Admitting: *Deleted

## 2016-06-04 ENCOUNTER — Emergency Department (HOSPITAL_COMMUNITY): Payer: Medicare Other

## 2016-06-04 DIAGNOSIS — S81811A Laceration without foreign body, right lower leg, initial encounter: Secondary | ICD-10-CM

## 2016-06-04 DIAGNOSIS — S91311A Laceration without foreign body, right foot, initial encounter: Secondary | ICD-10-CM | POA: Diagnosis not present

## 2016-06-04 DIAGNOSIS — M1990 Unspecified osteoarthritis, unspecified site: Secondary | ICD-10-CM | POA: Diagnosis not present

## 2016-06-04 DIAGNOSIS — Z87891 Personal history of nicotine dependence: Secondary | ICD-10-CM | POA: Insufficient documentation

## 2016-06-04 DIAGNOSIS — Z7982 Long term (current) use of aspirin: Secondary | ICD-10-CM | POA: Insufficient documentation

## 2016-06-04 DIAGNOSIS — Y939 Activity, unspecified: Secondary | ICD-10-CM | POA: Diagnosis not present

## 2016-06-04 DIAGNOSIS — J449 Chronic obstructive pulmonary disease, unspecified: Secondary | ICD-10-CM | POA: Insufficient documentation

## 2016-06-04 DIAGNOSIS — S8011XA Contusion of right lower leg, initial encounter: Secondary | ICD-10-CM | POA: Diagnosis not present

## 2016-06-04 DIAGNOSIS — Y999 Unspecified external cause status: Secondary | ICD-10-CM | POA: Insufficient documentation

## 2016-06-04 DIAGNOSIS — W1809XA Striking against other object with subsequent fall, initial encounter: Secondary | ICD-10-CM

## 2016-06-04 DIAGNOSIS — Z853 Personal history of malignant neoplasm of breast: Secondary | ICD-10-CM | POA: Diagnosis not present

## 2016-06-04 DIAGNOSIS — S9031XA Contusion of right foot, initial encounter: Secondary | ICD-10-CM | POA: Diagnosis not present

## 2016-06-04 DIAGNOSIS — M9903 Segmental and somatic dysfunction of lumbar region: Secondary | ICD-10-CM | POA: Diagnosis not present

## 2016-06-04 DIAGNOSIS — Z79899 Other long term (current) drug therapy: Secondary | ICD-10-CM | POA: Diagnosis not present

## 2016-06-04 DIAGNOSIS — I4891 Unspecified atrial fibrillation: Secondary | ICD-10-CM | POA: Insufficient documentation

## 2016-06-04 DIAGNOSIS — M7989 Other specified soft tissue disorders: Secondary | ICD-10-CM | POA: Diagnosis not present

## 2016-06-04 DIAGNOSIS — Y929 Unspecified place or not applicable: Secondary | ICD-10-CM | POA: Diagnosis not present

## 2016-06-04 DIAGNOSIS — W182XXA Fall in (into) shower or empty bathtub, initial encounter: Secondary | ICD-10-CM | POA: Insufficient documentation

## 2016-06-04 DIAGNOSIS — M5441 Lumbago with sciatica, right side: Secondary | ICD-10-CM | POA: Diagnosis not present

## 2016-06-04 DIAGNOSIS — M47816 Spondylosis without myelopathy or radiculopathy, lumbar region: Secondary | ICD-10-CM | POA: Diagnosis not present

## 2016-06-04 DIAGNOSIS — S8001XA Contusion of right knee, initial encounter: Secondary | ICD-10-CM | POA: Diagnosis not present

## 2016-06-04 DIAGNOSIS — I10 Essential (primary) hypertension: Secondary | ICD-10-CM | POA: Diagnosis not present

## 2016-06-04 DIAGNOSIS — T148XXA Other injury of unspecified body region, initial encounter: Secondary | ICD-10-CM

## 2016-06-04 DIAGNOSIS — S8991XA Unspecified injury of right lower leg, initial encounter: Secondary | ICD-10-CM | POA: Diagnosis present

## 2016-06-04 MED ORDER — TETANUS-DIPHTH-ACELL PERTUSSIS 5-2.5-18.5 LF-MCG/0.5 IM SUSP
0.5000 mL | Freq: Once | INTRAMUSCULAR | Status: AC
  Administered 2016-06-04: 0.5 mL via INTRAMUSCULAR
  Filled 2016-06-04: qty 0.5

## 2016-06-04 NOTE — ED Provider Notes (Signed)
CSN: [id number]     Arrival date & time 06/04/16  2104 History   First MD Initiated Contact with Patient 06/04/16 2139     Chief Complaint  Patient presents with  . Fall     (Consider location/radiation/quality/duration/timing/severity/associated sxs/prior Treatment) HPI  Elizabeth Sparks Is a very pleasant 75 year old female who presents to the emergency department with chief complaint of fall. The patient tripped in the shower and fell hurting her right foot, right shin and right knee. She denies hitting her head or losing consciousness. She complains of pain and swelling in both areas, just twisted. She does not take any blood thinners. She did have a large hematoma to admission which bled heavily. She is able to ambulate after the accident. She is not up-to-date on her tetanus vaccination. She did apply pressure to the hematoma on her shin and achieved hemostasis prior to arrival.  Past Medical History  Diagnosis Date  . Arthritis   . COPD (chronic obstructive pulmonary disease) ([place])   . Hypertension   . Thyroid disease   . Wheezing   . Shortness of breath   . GERD (gastroesophageal reflux disease)   . Complication of anesthesia     has had ilius  . Cancer ([place])     right breast  . S/P radiation therapy comp. 02/10/12    42.5 Gy / 16 Fractions to Right Breast at [facility]  . History of ongoing treatment with alendronate (Fosamax)   . Breast cancer ([place])   . Atrial fibrillation ([place]) 07/17/14   Past Surgical History  Procedure Laterality Date  . Rotator cuff repair  2001    left  . Pelvic tumors  2009  . Hernia repair  2011    abd wall hernia  . Abdominal hysterectomy  1974    part hyst  . Uterine fibroid surgery  2009    ovaries out  . Mastectomy, partial  11/20/11    right breast  . Breast surgery  11/20/2011    Rt partial mastectomy   Family History  Problem Relation Age of Onset  . Heart disease Mother   . Cancer Father     prostate  .  Heart disease Father   . Cancer Maternal Grandmother     liver   Social History  Substance Use Topics  . Smoking status: Former Smoker -- 0.50 packs/day for 45 years    Types: Cigarettes    Quit date: 10/28/2009  . Smokeless tobacco: Never Used  . Alcohol Use: 1.2 oz/week    2 Glasses of wine per week   OB History    No data available     Review of Systems  Ten systems reviewed and are negative for acute change, except as noted in the HPI.    Allergies  Codeine; Evista; and Procaine hcl  Home Medications   Prior to Admission medications   Medication Sig Start Date End Date Taking? Authorizing Provider  alendronate (FOSAMAX) 70 MG tablet Take 70 mg by mouth once a week.  06/10/15   Historical Provider, MD  aspirin 81 MG tablet Take 81 mg by mouth daily.     Historical Provider, MD  budesonide-formoterol (SYMBICORT) 160-4.5 MCG/ACT inhaler Inhale 2 puffs into the lungs 2 (two) times daily.      Historical Provider, MD  calcium carbonate (OS-CAL) 600 MG TABS Take 600 mg by mouth daily.    Historical Provider, MD  Cholecalciferol (VITAMIN D-3 PO) Take by mouth daily.  Historical Provider, MD  diltiazem (CARDIZEM CD) 120 MG 24 hr capsule Take 1 capsule (120 mg total) by mouth daily. 07/09/15   Jerline Pain, MD  esomeprazole (NEXIUM) 20 MG capsule Take 20 mg by mouth daily at 12 noon.     Historical Provider, MD  hydrochlorothiazide (HYDRODIURIL) 25 MG tablet Take 25 mg by mouth daily.      Historical Provider, MD  KLOR-CON 10 10 MEQ tablet Take 10 mEq by mouth daily.  03/12/14   Historical Provider, MD  losartan (COZAAR) 100 MG tablet Take 100 mg by mouth daily.    Historical Provider, MD  PROAIR HFA 108 (90 BASE) MCG/ACT inhaler 2 puffs every 4 (four) hours as needed.  06/04/15   Historical Provider, MD  simvastatin (ZOCOR) 10 MG tablet Take 10 mg by mouth at bedtime.      Historical Provider, MD  tiotropium (SPIRIVA) 18 MCG inhalation capsule Place 18 mcg into inhaler and  inhale daily.      Historical Provider, MD  traMADol (ULTRAM) 50 MG tablet 1 every 6-8 hours as needed for cough 05/07/16   Juanito Doom, MD   BP 169/61 mmHg  Pulse 79  Temp(Src) 97.4 F (36.3 C) (Temporal)  Resp 18  Ht 5' 3.5" (1.613 m)  Wt 57.607 kg  BMI 22.14 kg/m2  SpO2 99% Physical Exam  Constitutional: She is oriented to person, place, and time. She appears well-developed and well-nourished. No distress.  HENT:  Head: Normocephalic and atraumatic.  Eyes: Conjunctivae are normal. No scleral icterus.  Neck: Normal range of motion.  Cardiovascular: Normal rate, regular rhythm and normal heart sounds.  Exam reveals no gallop and no friction rub.   No murmur heard. Pulmonary/Chest: Effort normal and breath sounds normal. No respiratory distress.  Abdominal: Soft. Bowel sounds are normal. She exhibits no distension and no mass. There is no tenderness. There is no guarding.  Musculoskeletal:  Erythema and swelling over the distal first and second metatarsals with exquisite tenderness to palpation. Able to move toes and move. Neurovascularly intact with strong DP and PT pulses. There is a 4 cm hematoma with a small skin tear about 2 cm in length. It is very tender to palpation without active bleeding.  3 cm hematoma over the right knee. Minimal crepitus with palpation. Full range of motion of the knee. Pain with flexion.  Neurological: She is alert and oriented to person, place, and time.  Skin: Skin is warm and dry. She is not diaphoretic.    ED Course  Procedures (including critical care time) Labs Review Labs Reviewed - No data to display  Imaging Review No results found. I have personally reviewed and evaluated these images and lab results as part of my medical decision-making.   EKG Interpretation None      MDM   Final diagnoses:  Fall against object, initial encounter  Hematoma and contusion  Skin tear of lower leg without complication, right, initial  encounter    Patient with negative imaging. No tenderness to palpation of the fibular head of the right knee. Patient's wounds cleansed and bandaged. 2. Updated. Patient has a walker and cane at home for ambulatory aids, but is able to walk in the emergency department. She'll use Tylenol at home for pain. Advised ice and elevation. Discussed return precautions. Patient appears safe for discharge at this time. She is to follow-up with her primary care physician X few days.    Margarita Mail, PA-C 06/04/16 [place],  MD 06/08/16 1057

## 2016-06-04 NOTE — ED Notes (Signed)
Pt states she slipped in the shower. Pt has bruising to right knee, right shin, and right foot. Pt states she has a laceration to the front of her right shin. Bleeding controlled in triage.

## 2016-06-04 NOTE — Discharge Instructions (Signed)
Nonsutured Laceration Care A laceration is a cut that goes through all layers of the skin and extends into the tissue that is right under the skin. This type of cut is usually stitched up (sutured) or closed with tape (adhesive strips) or skin glue shortly after the injury happens. However, if the wound is dirty or if several hours pass before medical treatment is provided, it is likely that germs (bacteria) will enter the wound. Closing a laceration after bacteria have entered it increases the risk of infection. In these cases, your health care provider may leave the laceration open (nonsutured) and cover it with a bandage. This type of treatment helps prevent infection and allows the wound to heal from the deepest layer of tissue damage up to the surface. An open fracture is a type of injury that may involve nonsutured lacerations. An open fracture is a break in a bone that happens along with one or more lacerations through the skin that is near the fracture site. HOW TO CARE FOR YOUR NONSUTURED LACERATION  Take or apply over-the-counter and prescription medicines only as told by your health care provider.  If you were prescribed an antibiotic medicine, take or apply it as told by your health care provider. Do not stop using the antibiotic even if your condition improves.  Clean the wound one time each day or as told by your health care provider.  Wash the wound with mild soap and water.  Rinse the wound with water to remove all soap.  Pat your wound dry with a clean towel. Do not rub the wound.  Do not inject anything into the wound unless your health care provider told you to.  Change any bandages (dressings) as told by your health care provider. This includes changing the dressing if it gets wet, dirty, or starts to smell bad.  Keep the dressing dry until your health care provider says it can be removed. Do not take baths, swim, or do anything that puts your wound underwater until your  health care provider approves.  Raise (elevate) the injured area above the level of your heart while you are sitting or lying down, if possible.  Do not scratch or pick at the wound.  Check your wound every day for signs of infection. Watch for:  Redness, swelling, or pain.  Fluid, blood, or pus.  Keep all follow-up visits as told by your health care provider. This is important. SEEK MEDICAL CARE IF:  You received a tetanus and shot and you have swelling, severe pain, redness, or bleeding at the injection site.   You have a fever.  Your pain is not controlled with medicine.  You have increased redness, swelling, or pain at the site of your wound.  You have fluid, blood, or pus coming from your wound.  You notice a bad smell coming from your wound or your dressing.  You notice something coming out of the wound, such as wood or glass.  You notice a change in the color of your skin near your wound.  You develop a new rash.  You need to change the dressing frequently due to fluid, blood, or pus draining from the wound.  You develop numbness around your wound. SEEK IMMEDIATE MEDICAL CARE IF:  Your pain suddenly increases and is severe.  You develop severe swelling around the wound.  The wound is on your hand or foot and you cannot properly move a finger or toe.  The wound is on your hand or  foot and you notice that your fingers or toes look pale or bluish.  You have a red streak going away from your wound.   This information is not intended to replace advice given to you by your health care provider. Make sure you discuss any questions you have with your health care provider.   Document Released: 10/28/2006 Document Revised: 04/16/2015 Document Reviewed: 11/26/2014 Elsevier Interactive Patient Education 2016 Else[employer] Hematoma A hematoma is a collection of blood under the skin, in an organ, in a body space, in a joint space, or in other tissue. The blood can clot  to form a lump that you can see and feel. The lump is often firm and may sometimes become sore and tender. Most hematomas get better in a few days to weeks. However, some hematomas may be serious and require medical care. Hematomas can range in size from very small to very large. CAUSES  A hematoma can be caused by a blunt or penetrating injury. It can also be caused by spontaneous leakage from a blood vessel under the skin. Spontaneous leakage from a blood vessel is more likely to occur in older people, especially those taking blood thinners. Sometimes, a hematoma can develop after certain medical procedures. SIGNS AND SYMPTOMS   A firm lump on the body.  Possible pain and tenderness in the area.  Bruising.Blue, dark blue, purple-red, or yellowish skin may appear at the site of the hematoma if the hematoma is close to the surface of the skin. For hematomas in deeper tissues or body spaces, the signs and symptoms may be subtle. For example, an intra-abdominal hematoma may cause abdominal pain, weakness, fainting, and shortness of breath. An intracranial hematoma may cause a headache or symptoms such as weakness, trouble speaking, or a change in consciousness. DIAGNOSIS  A hematoma can usually be diagnosed based on your medical history and a physical exam. Imaging tests may be needed if your health care provider suspects a hematoma in deeper tissues or body spaces, such as the abdomen, head, or chest. These tests may include ultrasonography or a CT scan.  TREATMENT  Hematomas usually go away on their own over time. Rarely does the blood need to be drained out of the body. Large hematomas or those that may affect vital organs will sometimes need surgical drainage or monitoring. HOME CARE INSTRUCTIONS   Apply ice to the injured area:   Put ice in a plastic bag.   Place a towel between your skin and the bag.   Leave the ice on for 20 minutes, 2-3 times a day for the first 1 to 2 days.    After the first 2 days, switch to using warm compresses on the hematoma.   Elevate the injured area to help decrease pain and swelling. Wrapping the area with an elastic bandage may also be helpful. Compression helps to reduce swelling and promotes shrinking of the hematoma. Make sure the bandage is not wrapped too tight.   If your hematoma is on a lower extremity and is painful, crutches may be helpful for a couple days.   Only take over-the-counter or prescription medicines as directed by your health care provider. SEEK IMMEDIATE MEDICAL CARE IF:   You have increasing pain, or your pain is not controlled with medicine.   You have a fever.   You have worsening swelling or discoloration.   Your skin over the hematoma breaks or starts bleeding.   Your hematoma is in your chest or abdomen and  you have weakness, shortness of breath, or a change in consciousness.  Your hematoma is on your scalp (caused by a fall or injury) and you have a worsening headache or a change in alertness or consciousness. MAKE SURE YOU:   Understand these instructions.  Will watch your condition.  Will get help right away if you are not doing well or get worse.   This information is not intended to replace advice given to you by your health care provider. Make sure you discuss any questions you have with your health care provider.   Document Released: 07/14/2004 Document Revised: 08/02/2013 Document Reviewed: 05/10/2013 Elsevier Interactive Patient Education [employer].

## 2016-06-05 NOTE — ED Notes (Signed)
Pt alert & oriented x4, stable gait. Patient given discharge instructions, paperwork & prescription(s). Patient instructed to stop at the registration desk to finish any additional paperwork. Patient verbalized understanding. Pt left department in wheelchair. Pt left w/ no further questions.

## 2016-06-10 DIAGNOSIS — S8011XA Contusion of right lower leg, initial encounter: Secondary | ICD-10-CM | POA: Diagnosis not present

## 2016-06-19 DIAGNOSIS — S8011XA Contusion of right lower leg, initial encounter: Secondary | ICD-10-CM | POA: Diagnosis not present

## 2016-06-26 DIAGNOSIS — S81811D Laceration without foreign body, right lower leg, subsequent encounter: Secondary | ICD-10-CM | POA: Diagnosis not present

## 2016-06-26 DIAGNOSIS — L089 Local infection of the skin and subcutaneous tissue, unspecified: Secondary | ICD-10-CM | POA: Diagnosis not present

## 2016-06-30 ENCOUNTER — Other Ambulatory Visit: Payer: Self-pay | Admitting: Cardiology

## 2016-07-06 ENCOUNTER — Ambulatory Visit: Payer: Medicare Other | Admitting: Pulmonary Disease

## 2016-07-30 ENCOUNTER — Other Ambulatory Visit: Payer: Self-pay | Admitting: Cardiology

## 2016-08-05 DIAGNOSIS — D692 Other nonthrombocytopenic purpura: Secondary | ICD-10-CM | POA: Diagnosis not present

## 2016-08-05 DIAGNOSIS — L814 Other melanin hyperpigmentation: Secondary | ICD-10-CM | POA: Diagnosis not present

## 2016-08-05 DIAGNOSIS — D225 Melanocytic nevi of trunk: Secondary | ICD-10-CM | POA: Diagnosis not present

## 2016-08-05 DIAGNOSIS — D224 Melanocytic nevi of scalp and neck: Secondary | ICD-10-CM | POA: Diagnosis not present

## 2016-08-05 DIAGNOSIS — L720 Epidermal cyst: Secondary | ICD-10-CM | POA: Diagnosis not present

## 2016-08-05 DIAGNOSIS — D1801 Hemangioma of skin and subcutaneous tissue: Secondary | ICD-10-CM | POA: Diagnosis not present

## 2016-08-05 DIAGNOSIS — D2261 Melanocytic nevi of right upper limb, including shoulder: Secondary | ICD-10-CM | POA: Diagnosis not present

## 2016-08-13 ENCOUNTER — Ambulatory Visit (INDEPENDENT_AMBULATORY_CARE_PROVIDER_SITE_OTHER): Payer: Medicare Other | Admitting: Pulmonary Disease

## 2016-08-13 ENCOUNTER — Encounter: Payer: Self-pay | Admitting: Pulmonary Disease

## 2016-08-13 DIAGNOSIS — R053 Chronic cough: Secondary | ICD-10-CM

## 2016-08-13 DIAGNOSIS — J479 Bronchiectasis, uncomplicated: Secondary | ICD-10-CM | POA: Diagnosis not present

## 2016-08-13 DIAGNOSIS — J309 Allergic rhinitis, unspecified: Secondary | ICD-10-CM | POA: Diagnosis not present

## 2016-08-13 DIAGNOSIS — K219 Gastro-esophageal reflux disease without esophagitis: Secondary | ICD-10-CM

## 2016-08-13 DIAGNOSIS — R05 Cough: Secondary | ICD-10-CM

## 2016-08-13 MED ORDER — TRAMADOL HCL 50 MG PO TABS: ORAL_TABLET | ORAL | 1 refills | Status: DC

## 2016-08-13 MED ORDER — BUDESONIDE-FORMOTEROL FUMARATE 160-4.5 MCG/ACT IN AERO: 2.0000 | INHALATION_SPRAY | Freq: Two times a day (BID) | RESPIRATORY_TRACT | 0 refills | Status: DC

## 2016-08-13 MED ORDER — TIOTROPIUM BROMIDE MONOHYDRATE 2.5 MCG/ACT IN AERS: 2.0000 | INHALATION_SPRAY | Freq: Every day | RESPIRATORY_TRACT | 0 refills | Status: AC

## 2016-08-13 NOTE — Addendum Note (Signed)
Addended by: Len Blalock on: 08/13/2016 11:07 AM   Modules accepted: Orders

## 2016-08-13 NOTE — Assessment & Plan Note (Signed)
Worse recently despite Nexium. I recommended she add Pepcid at night in addition to Nexium.

## 2016-08-13 NOTE — Patient Instructions (Signed)
Keep taking Spiriva and Symbicort as you are doing For the cough: Use Neil Med rinses with distilled water at least twice per day using the instructions on the package. 1/2 hour after using the [facility] Med rinse, use fluticasone two puffs in each nostril once per day.  Remember that the fluticasone can take 1-2 weeks to work after regular use. Use generic zyrtec (cetirizine) every day.  If this doesn't help, then stop taking it and use chlorpheniramine-phenylephrine combination tablets.  You need to try to suppress your cough to allow your larynx (voice box) to heal.  For three days don't talk, laugh, sing, or clear your throat. Do everything you can to suppress the cough during this time. Use hard candies (sugarless Jolly Ranchers) or non-mint or non-menthol containing cough drops during this time to soothe your throat.  Use a cough suppressant (Delsym or what I have prescribed you) around the clock during this time.  After three days, gradually increase the use of your voice and back off on the cough suppressants.  Take Pepcid in the evening  We will see you back in 6 months or sooner if needed

## 2016-08-13 NOTE — Assessment & Plan Note (Signed)
Due to postnasal drip and acid reflux which are both worse.  Plan: Continue tramadol as needed for cough Place rest encourage Change treatment for allergic rhinitis and acid reflux as detailed above

## 2016-08-13 NOTE — Assessment & Plan Note (Signed)
This is been more of a problem for her recently  I recommended the following: Use Neil Med rinses with distilled water at least twice per day using the instructions on the package. 1/2 hour after using the [facility] Med rinse, use Nasacort two puffs in each nostril once per day.  Remember that the Nasacort can take 1-2 weeks to work after regular use. Use generic zyrtec (cetirizine) every day.  If this doesn't help, then stop taking it and use chlorpheniramine-phenylephrine combination tablets.

## 2016-08-13 NOTE — Assessment & Plan Note (Signed)
Stable interval, no exacerbations.  Plan: She will get a flu shot from her primary care physician Continue Symbicort and Spiriva Follow-up 6 months

## 2016-08-13 NOTE — Progress Notes (Signed)
Subjective:    Patient ID: Elizabeth Sparks, female    DOB: 04-Feb-1941, 75 y.o.   MRN: [id number]  Synopsis: Former patient of Dr. Gwenette Greet with bronchiectasis and COPD. Had pertussis in forth grade.  CT chest 2012:  +bronchiectasis esp RUL, + tree and bud c/w MAC colonization.  Cultures 12/2014:  Neg bacteria and neg AFB 05/2015 HRCT > bronchiectasis (mild, upper lobe)and mild emphysema PFT's 2010:  FEV1 1.39 (66%), ratio 63, +airtrapping, no restriction, DLCO 64% Significant smoking history Has chronic cough controlled with tramadol.  HPI Chief Complaint  Patient presents with  . Follow-up    pt states overall she is doing well, notes SOB, aching in chest during hot/humid weather.  Pt also notes a nonprod cough worse qhs.     Sha[place] was supposed to see [place] in July but she had to cancel due to a fall with some minor injuries.  She is better now.  She is doing well.  No breathing trouble really.  She has times when she can't "get a good breath" which bothers her, more on the hot days.  She is not limited by any breathing difficulty.  She has been maintaining her garden without difficulty.  She has not been doing much in the way of yard work as before, but she still does things like weeding her garden.    Her husband notes that she coughs a lot.  She says that she has a tickling sensation in her throat.  Chewing gum helps, sinus congestion is bad lately.  She is taking some allergy pills.  She will still have heartburn and indigestion despite taking nexium.  Some breads will make this worse.  Cough will be present when she lies flat.  Now worse during daytime.  Tramadol helps at night. The cough is dry.     Past Medical History:  Diagnosis Date  . Arthritis   . Atrial fibrillation ([place]) 07/17/14  . Breast cancer ([place])   . Cancer ([place])    right breast  . Complication of anesthesia    has had ilius  . COPD (chronic obstructive pulmonary disease) ([place])   . GERD (gastroesophageal reflux  disease)   . History of ongoing treatment with alendronate (Fosamax)   . Hypertension   . S/P radiation therapy comp. 02/10/12   42.5 Gy / 16 Fractions to Right Breast at [facility]  . Shortness of breath   . Thyroid disease   . Wheezing       Review of Systems  Constitutional: Negative for fever and unexpected weight change.  HENT: Positive for postnasal drip, rhinorrhea and sinus pressure. Negative for congestion, dental problem, ear pain, nosebleeds, sneezing, sore throat and trouble swallowing.   Eyes: Negative for redness and itching.  Respiratory: Positive for cough, chest tightness and shortness of breath. Negative for wheezing.   Cardiovascular: Negative for palpitations and leg swelling.  Skin: Negative for rash.       Objective:   Physical Exam Vitals:   08/13/16 1007  BP: 128/72  Pulse: 62  SpO2: 97%  Weight: 129 lb 12.8 oz (58.9 kg)  Height: 5\' 3"  (1.6 m)   RA  Gen: well appearing HENT: OP clear, TM's clear, neck supple PULM: CTA B, normal percussion CV: RRR, no mgr, trace edema GI: BS+, soft, nontender Derm: no cyanosis or rash Psyche: normal mood and affect  CBC    Component Value Date/Time   WBC 7.5 06/10/2014 1059   RBC 4.65 06/10/2014 1059  HGB 13.2 06/10/2014 1059   HCT 39.0 06/10/2014 1059   PLT 237 06/10/2014 1059   MCV 83.9 06/10/2014 1059   MCH 28.4 06/10/2014 1059   MCHC 33.8 06/10/2014 1059   RDW 12.8 06/10/2014 1059   LYMPHSABS 1.1 06/10/2014 1059   MONOABS 0.6 06/10/2014 1059   EOSABS 0.1 06/10/2014 1059   BASOSABS 0.0 06/10/2014 1059        Assessment & Plan:  Allergic rhinitis This is been more of a problem for her recently  I recommended the following: Use Neil Med rinses with distilled water at least twice per day using the instructions on the package. 1/2 hour after using the [facility] Med rinse, use Nasacort two puffs in each nostril once per day.  Remember that the Nasacort can take 1-2 weeks to work  after regular use. Use generic zyrtec (cetirizine) every day.  If this doesn't help, then stop taking it and use chlorpheniramine-phenylephrine combination tablets.     GERD (gastroesophageal reflux disease) Worse recently despite Nexium. I recommended she add Pepcid at night in addition to Nexium.  Bronchiectasis without acute exacerbation (HCC) Stable interval, no exacerbations.  Plan: She will get a flu shot from her primary care physician Continue Symbicort and Spiriva Follow-up 6 months  Chronic cough Due to postnasal drip and acid reflux which are both worse.  Plan: Continue tramadol as needed for cough Place rest encourage Change treatment for allergic rhinitis and acid reflux as detailed above    Current Outpatient Prescriptions:  .  alendronate (FOSAMAX) 70 MG tablet, Take 70 mg by mouth once a week. , Disp: , Rfl: 9 .  aspirin 81 MG tablet, Take 81 mg by mouth daily. , Disp: , Rfl:  .  budesonide-formoterol (SYMBICORT) 160-4.5 MCG/ACT inhaler, Inhale 2 puffs into the lungs 2 (two) times daily.  , Disp: , Rfl:  .  calcium carbonate (OS-CAL) 600 MG TABS, Take 600 mg by mouth daily., Disp: , Rfl:  .  Cholecalciferol (VITAMIN D-3 PO), Take by mouth daily.  , Disp: , Rfl:  .  diltiazem (CARDIZEM CD) 120 MG 24 hr capsule, Take 1 capsule (120 mg total) by mouth daily., Disp: 30 capsule, Rfl: 0 .  esomeprazole (NEXIUM) 20 MG capsule, Take 20 mg by mouth daily at 12 noon. , Disp: , Rfl:  .  hydrochlorothiazide (HYDRODIURIL) 25 MG tablet, Take 25 mg by mouth daily.  , Disp: , Rfl:  .  KLOR-CON 10 10 MEQ tablet, Take 10 mEq by mouth daily. , Disp: , Rfl:  .  losartan (COZAAR) 100 MG tablet, Take 100 mg by mouth daily., Disp: , Rfl:  .  PROAIR HFA 108 (90 BASE) MCG/ACT inhaler, 2 puffs every 4 (four) hours as needed. , Disp: , Rfl: 4 .  simvastatin (ZOCOR) 10 MG tablet, Take 10 mg by mouth at bedtime.  , Disp: , Rfl:  .  tiotropium (SPIRIVA) 18 MCG inhalation capsule, Place 18  mcg into inhaler and inhale daily.  , Disp: , Rfl:  .  traMADol (ULTRAM) 50 MG tablet, 1 every 6-8 hours as needed for cough, Disp: 50 tablet, Rfl: 1

## 2016-08-19 ENCOUNTER — Telehealth: Payer: Self-pay | Admitting: Pulmonary Disease

## 2016-08-19 NOTE — Telephone Encounter (Signed)
Called the pharmacy to check on the refill of the tramadol that was called into the pharmacy on 8/31.  Pharmacy stated that they did not have any refills for the pt on tramadol since June.  This was given over the phone and I have called the pt and she is aware. Nothing further is needed.

## 2016-08-31 ENCOUNTER — Other Ambulatory Visit: Payer: Self-pay | Admitting: Cardiology

## 2016-09-01 ENCOUNTER — Other Ambulatory Visit: Payer: Self-pay | Admitting: *Deleted

## 2016-09-01 MED ORDER — DILTIAZEM HCL ER COATED BEADS 120 MG PO CP24: 120.0000 mg | ORAL_CAPSULE | Freq: Every day | ORAL | 0 refills | Status: DC

## 2016-09-14 ENCOUNTER — Other Ambulatory Visit: Payer: Self-pay | Admitting: Internal Medicine

## 2016-09-14 DIAGNOSIS — Z853 Personal history of malignant neoplasm of breast: Secondary | ICD-10-CM

## 2016-09-21 DIAGNOSIS — Z23 Encounter for immunization: Secondary | ICD-10-CM | POA: Diagnosis not present

## 2016-09-28 ENCOUNTER — Other Ambulatory Visit: Payer: Self-pay | Admitting: Cardiology

## 2016-09-30 DIAGNOSIS — I10 Essential (primary) hypertension: Secondary | ICD-10-CM | POA: Diagnosis not present

## 2016-09-30 DIAGNOSIS — Z1389 Encounter for screening for other disorder: Secondary | ICD-10-CM | POA: Diagnosis not present

## 2016-09-30 DIAGNOSIS — Z0000 Encounter for general adult medical examination without abnormal findings: Secondary | ICD-10-CM | POA: Diagnosis not present

## 2016-09-30 DIAGNOSIS — M810 Age-related osteoporosis without current pathological fracture: Secondary | ICD-10-CM | POA: Diagnosis not present

## 2016-09-30 DIAGNOSIS — I2510 Atherosclerotic heart disease of native coronary artery without angina pectoris: Secondary | ICD-10-CM | POA: Diagnosis not present

## 2016-09-30 DIAGNOSIS — J449 Chronic obstructive pulmonary disease, unspecified: Secondary | ICD-10-CM | POA: Diagnosis not present

## 2016-09-30 DIAGNOSIS — C50919 Malignant neoplasm of unspecified site of unspecified female breast: Secondary | ICD-10-CM | POA: Diagnosis not present

## 2016-10-23 ENCOUNTER — Ambulatory Visit
Admission: RE | Admit: 2016-10-23 | Discharge: 2016-10-23 | Disposition: A | Discharge status: Home / Self Care | Payer: Medicare Other | Source: Ambulatory Visit | Attending: Internal Medicine | Admitting: Internal Medicine

## 2016-10-23 ENCOUNTER — Other Ambulatory Visit: Payer: Self-pay | Admitting: *Deleted

## 2016-10-23 DIAGNOSIS — R928 Other abnormal and inconclusive findings on diagnostic imaging of breast: Secondary | ICD-10-CM | POA: Diagnosis not present

## 2016-10-23 DIAGNOSIS — Z853 Personal history of malignant neoplasm of breast: Secondary | ICD-10-CM

## 2016-10-23 MED ORDER — DILTIAZEM HCL ER COATED BEADS 120 MG PO CP24: 120.0000 mg | ORAL_CAPSULE | Freq: Every day | ORAL | 0 refills | Status: AC

## 2016-10-30 DIAGNOSIS — L821 Other seborrheic keratosis: Secondary | ICD-10-CM | POA: Diagnosis not present

## 2017-01-18 ENCOUNTER — Other Ambulatory Visit: Payer: Self-pay | Admitting: Cardiology

## 2017-01-26 DIAGNOSIS — H40033 Anatomical narrow angle, bilateral: Secondary | ICD-10-CM | POA: Diagnosis not present

## 2017-01-26 DIAGNOSIS — H2513 Age-related nuclear cataract, bilateral: Secondary | ICD-10-CM | POA: Diagnosis not present

## 2017-02-10 ENCOUNTER — Ambulatory Visit: Payer: Medicare Other | Admitting: Pulmonary Disease

## 2017-02-18 ENCOUNTER — Encounter: Payer: Self-pay | Admitting: Pulmonary Disease

## 2017-02-18 ENCOUNTER — Ambulatory Visit (INDEPENDENT_AMBULATORY_CARE_PROVIDER_SITE_OTHER): Payer: Medicare Other | Admitting: Pulmonary Disease

## 2017-02-18 DIAGNOSIS — J479 Bronchiectasis, uncomplicated: Secondary | ICD-10-CM | POA: Diagnosis not present

## 2017-02-18 DIAGNOSIS — R05 Cough: Secondary | ICD-10-CM

## 2017-02-18 DIAGNOSIS — R053 Chronic cough: Secondary | ICD-10-CM

## 2017-02-18 MED ORDER — FLUTTER DEVI: 0 refills | Status: AC

## 2017-02-18 MED ORDER — TRAMADOL HCL 50 MG PO TABS: ORAL_TABLET | ORAL | 1 refills | Status: DC

## 2017-02-18 NOTE — Assessment & Plan Note (Signed)
Stable interval. Still doing well with tramadol which she uses nightly.

## 2017-02-18 NOTE — Patient Instructions (Signed)
In the event of increasing symptoms use your flutter valve 4-5 breath, 4-5 times a day, take Mucinex twice a day, and drink extra fluids. If you still have symptoms after doing this for 2-3 days you needs to call me so I can call in an antibiotic.  He taking Symbicort and Spiriva as you're doing  Keep using the tramadol as needed for cough  I will see you back in 6 months or sooner if needed

## 2017-02-18 NOTE — Progress Notes (Signed)
Subjective:    Patient ID: Elizabeth Sparks, female    DOB: Mar 28, 1941, 76 y.o.   MRN: [id number]  Synopsis: Former patient of Dr. Gwenette Greet with bronchiectasis and COPD. Had pertussis in forth grade.  CT chest 2012:  +bronchiectasis esp RUL, + tree and bud c/w MAC colonization.  Cultures 12/2014:  Neg bacteria and neg AFB 05/2015 HRCT > bronchiectasis (mild, upper lobe)and mild emphysema PFT's 2010:  FEV1 1.39 (66%), ratio 63, +airtrapping, no restriction, DLCO 64% Significant smoking history Has chronic cough controlled with tramadol.  HPI Chief Complaint  Patient presents with  . Follow-up    pt doing well, notes some pnd, nonprod cough worse with season changes.      [name] says that when the weather changes she has more chest congestion and hoarseness.  However despite this she has been doing OK.  Her breathing has been about the same.  She said there were a couple of days when she didn't feel great, but this has resolved.  She needs a refill on the tramadol, she still uses it nightly and it helps with her cough.  She is still taking her symbicort and spiriva regularly.    Past Medical History:  Diagnosis Date  . Arthritis   . Atrial fibrillation ([place]) 07/17/14  . Breast cancer ([place])   . Cancer ([place])    right breast  . Complication of anesthesia    has had ilius  . COPD (chronic obstructive pulmonary disease) ([place])   . GERD (gastroesophageal reflux disease)   . History of ongoing treatment with alendronate (Fosamax)   . Hypertension   . S/P radiation therapy comp. 02/10/12   42.5 Gy / 16 Fractions to [facility] Breast at [facility]  . Shortness of breath   . Thyroid disease   . Wheezing       Review of Systems  Constitutional: Negative for fever and unexpected weight change.  HENT: Negative for congestion, dental problem, ear pain, nosebleeds, postnasal drip, rhinorrhea, sinus pressure, sneezing, sore throat and trouble swallowing.   Eyes: Negative for  redness and itching.  Respiratory: Negative for cough, chest tightness, shortness of breath and wheezing.   Cardiovascular: Negative for palpitations and leg swelling.  Skin: Negative for rash.       Objective:   Physical Exam Vitals:   02/18/17 1518  BP: 124/66  Pulse: 65  SpO2: 100%  Weight: 132 lb (59.9 kg)  Height: 5' 3"  (1.6 m)   RA  Gen: well appearing HENT: OP clear, TM's clear, neck supple PULM: Few crackles Left base B, normal percussion CV: RRR, no mgr, trace edema GI: BS+, soft, nontender Derm: no cyanosis or rash Psyche: normal mood and affect   CBC    Component Value Date/Time   WBC 7.5 06/10/2014 1059   RBC 4.65 06/10/2014 1059   HGB 13.2 06/10/2014 1059   HCT 39.0 06/10/2014 1059   PLT 237 06/10/2014 1059   MCV 83.9 06/10/2014 1059   MCH 28.4 06/10/2014 1059   MCHC 33.8 06/10/2014 1059   RDW 12.8 06/10/2014 1059   LYMPHSABS 1.1 06/10/2014 1059   MONOABS 0.6 06/10/2014 1059   EOSABS 0.1 06/10/2014 1059   BASOSABS 0.0 06/10/2014 1059        Assessment & Plan:  Bronchiectasis without acute exacerbation ([place]) This has been a stable interval for her without exacerbation.  Her disease is well controlled without exacerbations.  Plan: Continue Symbicort and Spiriva In the event of increasing symptoms I have  advised her to use her flutter valve 4-5 breath, 4-5 times a day, take Mucinex twice a day, and drink extra fluids. If she still has symptoms after doing this for 2-3 days she needs to call me second call in an antibiotic.  Chronic cough Stable interval. Still doing well with tramadol which she uses nightly.    Current Outpatient Prescriptions:  .  alendronate (FOSAMAX) 70 MG tablet, Take 70 mg by mouth once a week. , Disp: , Rfl: 9 .  aspirin 81 MG tablet, Take 81 mg by mouth daily. , Disp: , Rfl:  .  budesonide-formoterol (SYMBICORT) 160-4.5 MCG/ACT inhaler, Inhale 2 puffs into the lungs 2 (two) times daily.  , Disp: , Rfl:  .   budesonide-formoterol (SYMBICORT) 160-4.5 MCG/ACT inhaler, Inhale 2 puffs into the lungs 2 (two) times daily., Disp: 2 Inhaler, Rfl: 0 .  calcium carbonate (OS-CAL) 600 MG TABS, Take 600 mg by mouth daily., Disp: , Rfl:  .  Cholecalciferol (VITAMIN D-3 PO), Take by mouth daily.  , Disp: , Rfl:  .  diltiazem (CARDIZEM CD) 120 MG 24 hr capsule, Take 1 capsule (120 mg total) by mouth daily. Further refills to be obtained from PCP, Disp: 90 capsule, Rfl: 0 .  esomeprazole (NEXIUM) 20 MG capsule, Take 20 mg by mouth daily at 12 noon. , Disp: , Rfl:  .  hydrochlorothiazide (HYDRODIURIL) 25 MG tablet, Take 25 mg by mouth daily.  , Disp: , Rfl:  .  KLOR-CON 10 10 MEQ tablet, Take 10 mEq by mouth daily. , Disp: , Rfl:  .  losartan (COZAAR) 100 MG tablet, Take 100 mg by mouth daily., Disp: , Rfl:  .  PROAIR HFA 108 (90 BASE) MCG/ACT inhaler, 2 puffs every 4 (four) hours as needed. , Disp: , Rfl: 4 .  simvastatin (ZOCOR) 10 MG tablet, Take 10 mg by mouth at bedtime.  , Disp: , Rfl:  .  tiotropium (SPIRIVA) 18 MCG inhalation capsule, Place 18 mcg into inhaler and inhale daily.  , Disp: , Rfl:  .  traMADol (ULTRAM) 50 MG tablet, 1 every 6-8 hours as needed for cough, Disp: 50 tablet, Rfl: 1 .  Respiratory Therapy Supplies (FLUTTER) DEVI, Use as directed, Disp: 1 each, Rfl: 0 .  Tiotropium Bromide Monohydrate (SPIRIVA RESPIMAT) 2.5 MCG/ACT AERS, Inhale 2 puffs into the lungs daily., Disp: 2 Inhaler, Rfl: 0

## 2017-02-18 NOTE — Assessment & Plan Note (Signed)
This has been a stable interval for her without exacerbation.  Her disease is well controlled without exacerbations.  Plan: Continue Symbicort and Spiriva In the event of increasing symptoms I have advised her to use her flutter valve 4-5 breath, 4-5 times a day, take Mucinex twice a day, and drink extra fluids. If she still has symptoms after doing this for 2-3 days she needs to call me second call in an antibiotic.

## 2017-04-27 DIAGNOSIS — I10 Essential (primary) hypertension: Secondary | ICD-10-CM | POA: Diagnosis not present

## 2017-04-27 DIAGNOSIS — C50411 Malignant neoplasm of upper-outer quadrant of right female breast: Secondary | ICD-10-CM | POA: Diagnosis not present

## 2017-04-27 DIAGNOSIS — J449 Chronic obstructive pulmonary disease, unspecified: Secondary | ICD-10-CM | POA: Diagnosis not present

## 2017-04-30 ENCOUNTER — Other Ambulatory Visit: Payer: Self-pay | Admitting: Pulmonary Disease

## 2017-04-30 NOTE — Telephone Encounter (Signed)
Pt is requesting a refill on Tramadol. Last refill: 02/18/17 #50 with 1 refill, take 1 po q6-8h prn cough.  Last ov: 02/18/17 Next ov: 08/23/17  BQ please advise- ok to refill?  Thanks!

## 2017-05-08 DIAGNOSIS — S51002A Unspecified open wound of left elbow, initial encounter: Secondary | ICD-10-CM | POA: Diagnosis not present

## 2017-05-17 DIAGNOSIS — S41112D Laceration without foreign body of left upper arm, subsequent encounter: Secondary | ICD-10-CM | POA: Diagnosis not present

## 2017-05-17 DIAGNOSIS — Z4802 Encounter for removal of sutures: Secondary | ICD-10-CM | POA: Diagnosis not present

## 2017-05-27 DIAGNOSIS — S41112A Laceration without foreign body of left upper arm, initial encounter: Secondary | ICD-10-CM | POA: Diagnosis not present

## 2017-06-18 ENCOUNTER — Telehealth: Payer: Self-pay | Admitting: Pulmonary Disease

## 2017-06-18 MED ORDER — TRAMADOL HCL 50 MG PO TABS: ORAL_TABLET | ORAL | 1 refills | Status: DC

## 2017-06-18 NOTE — Telephone Encounter (Signed)
Spoke with patient. She is aware that the refill has been approved. Tramadol has been called into [facility]. Nothing further needed at time of call.

## 2017-06-18 NOTE — Telephone Encounter (Signed)
Okay to send refill. 

## 2017-06-18 NOTE — Telephone Encounter (Signed)
VS please advise if ok to send in refill of the tramadol for the pt.  Pt stated that she has been out for 2 days.  Thanks

## 2017-06-21 ENCOUNTER — Emergency Department (HOSPITAL_COMMUNITY)
Admission: EM | Admit: 2017-06-21 | Discharge: 2017-06-21 | Disposition: A | Discharge status: Home / Self Care | Payer: Medicare Other | Attending: Emergency Medicine | Admitting: Emergency Medicine

## 2017-06-21 ENCOUNTER — Encounter (HOSPITAL_COMMUNITY): Payer: Self-pay | Admitting: Emergency Medicine

## 2017-06-21 DIAGNOSIS — S81812A Laceration without foreign body, left lower leg, initial encounter: Secondary | ICD-10-CM | POA: Diagnosis not present

## 2017-06-21 DIAGNOSIS — Z79899 Other long term (current) drug therapy: Secondary | ICD-10-CM | POA: Diagnosis not present

## 2017-06-21 DIAGNOSIS — I10 Essential (primary) hypertension: Secondary | ICD-10-CM | POA: Insufficient documentation

## 2017-06-21 DIAGNOSIS — S81802A Unspecified open wound, left lower leg, initial encounter: Secondary | ICD-10-CM | POA: Diagnosis not present

## 2017-06-21 DIAGNOSIS — Z853 Personal history of malignant neoplasm of breast: Secondary | ICD-10-CM | POA: Diagnosis not present

## 2017-06-21 DIAGNOSIS — Y999 Unspecified external cause status: Secondary | ICD-10-CM | POA: Insufficient documentation

## 2017-06-21 DIAGNOSIS — J449 Chronic obstructive pulmonary disease, unspecified: Secondary | ICD-10-CM | POA: Insufficient documentation

## 2017-06-21 DIAGNOSIS — W228XXA Striking against or struck by other objects, initial encounter: Secondary | ICD-10-CM | POA: Diagnosis not present

## 2017-06-21 DIAGNOSIS — Y939 Activity, unspecified: Secondary | ICD-10-CM | POA: Diagnosis not present

## 2017-06-21 DIAGNOSIS — Z87891 Personal history of nicotine dependence: Secondary | ICD-10-CM | POA: Diagnosis not present

## 2017-06-21 DIAGNOSIS — L089 Local infection of the skin and subcutaneous tissue, unspecified: Secondary | ICD-10-CM | POA: Diagnosis not present

## 2017-06-21 DIAGNOSIS — Y929 Unspecified place or not applicable: Secondary | ICD-10-CM | POA: Insufficient documentation

## 2017-06-21 DIAGNOSIS — T148XXA Other injury of unspecified body region, initial encounter: Secondary | ICD-10-CM

## 2017-06-21 LAB — CBC WITH DIFFERENTIAL/PLATELET
Basophils Absolute: 0 10*3/uL (ref 0.0–0.1)
Basophils Relative: 0 %
EOS ABS: 0.1 10*3/uL (ref 0.0–0.7)
EOS PCT: 1 %
HCT: 37.3 % (ref 36.0–46.0)
HEMOGLOBIN: 12.4 g/dL (ref 12.0–15.0)
Lymphocytes Relative: 16 %
Lymphs Abs: 1 10*3/uL (ref 0.7–4.0)
MCH: 27.4 pg (ref 26.0–34.0)
MCHC: 33.2 g/dL (ref 30.0–36.0)
MCV: 82.3 fL (ref 78.0–100.0)
MONOS PCT: 11 %
Monocytes Absolute: 0.6 10*3/uL (ref 0.1–1.0)
NEUTROS PCT: 72 %
Neutro Abs: 4.3 10*3/uL (ref 1.7–7.7)
Platelets: 251 10*3/uL (ref 150–400)
RBC: 4.53 MIL/uL (ref 3.87–5.11)
RDW: 13.5 % (ref 11.5–15.5)
WBC: 6 10*3/uL (ref 4.0–10.5)

## 2017-06-21 LAB — BASIC METABOLIC PANEL
Anion gap: 10 (ref 5–15)
BUN: 16 mg/dL (ref 6–20)
CALCIUM: 9.6 mg/dL (ref 8.9–10.3)
CHLORIDE: 95 mmol/L — AB (ref 101–111)
CO2: 28 mmol/L (ref 22–32)
CREATININE: 0.7 mg/dL (ref 0.44–1.00)
Glucose, Bld: 116 mg/dL — ABNORMAL HIGH (ref 65–99)
Potassium: 3.4 mmol/L — ABNORMAL LOW (ref 3.5–5.1)
Sodium: 133 mmol/L — ABNORMAL LOW (ref 135–145)

## 2017-06-21 MED ORDER — CLINDAMYCIN PHOSPHATE 600 MG/50ML IV SOLN
INTRAVENOUS | Status: AC
  Filled 2017-06-21: qty 50

## 2017-06-21 MED ORDER — CLINDAMYCIN PHOSPHATE 600 MG/50ML IV SOLN
600.0000 mg | Freq: Once | INTRAVENOUS | Status: AC
  Administered 2017-06-21: 600 mg via INTRAVENOUS

## 2017-06-21 MED ORDER — SULFAMETHOXAZOLE-TRIMETHOPRIM 800-160 MG PO TABS: 1.0000 | ORAL_TABLET | Freq: Two times a day (BID) | ORAL | 0 refills | Status: AC

## 2017-06-21 NOTE — ED Triage Notes (Signed)
Pt reports cutting the back of her left leg on the running board of a car on Saturday.  Yesterday began looking red and feeling very tight.  States she has been cleaning it with peroxide and applying polysporin.

## 2017-06-21 NOTE — Discharge Instructions (Signed)
Elevate your leg when possible.  Clean the wound with mild soap and water and keep it bandaged.  Return here or your primary provider's office in 2 days for recheck.  Return here sooner if your develop increased redness, swelling or pain.

## 2017-06-23 DIAGNOSIS — S81812A Laceration without foreign body, left lower leg, initial encounter: Secondary | ICD-10-CM | POA: Diagnosis not present

## 2017-06-24 NOTE — ED Provider Notes (Signed)
[facility] DEPT Provider Note   CSN: [id number] Arrival date & time: 06/21/17  1130     History   Chief Complaint Chief Complaint  Patient presents with  . Wound Infection    HPI Elizabeth Sparks is a 76 y.o. female.  HPI  Elizabeth Sparks is a 76 y.o. female who presents to the Emergency Department complaining of laceration ot the posterior left lower leg that occurred two days prior to arrival.  Injury occurred while attempted to step out of a vehicle, scraping her leg on a metal running board on the vehicle.  She complains of increasing redness around the wound and "tightness" of the surrounding skin.  She has been cleaning the wound with peroxide and applying polysporin.  She denies fever, chills, calf pain or swelling, and drainage of the wound. Last Td is up to date    Past Medical History:  Diagnosis Date  . Arthritis   . Atrial fibrillation ([place]) 07/17/14  . Breast cancer ([place])   . Cancer ([place])    right breast  . Complication of anesthesia    has had ilius  . COPD (chronic obstructive pulmonary disease) ([place])   . GERD (gastroesophageal reflux disease)   . History of ongoing treatment with alendronate (Fosamax)   . Hypertension   . S/P radiation therapy comp. 02/10/12   42.5 Gy / 16 Fractions to Right Breast at [facility]  . Shortness of breath   . Thyroid disease   . Wheezing     Patient Active Problem List   Diagnosis Date Noted  . GERD (gastroesophageal reflux disease) 08/13/2016  . Thrush 07/24/2015  . PAT (paroxysmal atrial tachycardia) ([place]) 07/17/2014  . COPD (chronic obstructive pulmonary disease) with emphysema ([place]) 05/28/2014  . Cancer of central portion of female breast ([place]) 04/21/2012  . 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 HYPERLIPIDEMIA 12/25/2010  . HYPERTENSION 12/25/2010  . Allergic rhinitis 12/25/2010  . Bronchiectasis without acute exacerbation ([place]) 12/25/2010  . Chronic cough 12/25/2010     Past Surgical History:  Procedure Laterality Date  . ABDOMINAL HYSTERECTOMY  1974   part hyst  . BREAST SURGERY  11/20/2011   Rt partial mastectomy  . HERNIA REPAIR  2011   abd wall hernia  . MASTECTOMY, PARTIAL  11/20/11   right breast  . pelvic tumors  2009  . ROTATOR CUFF REPAIR  2001   left  . UTERINE FIBROID SURGERY  2009   ovaries out    OB History    No data available       Home Medications    Prior to Admission medications   Medication Sig Start Date End Date Taking? Authorizing Provider  alendronate (FOSAMAX) 70 MG tablet Take 70 mg by mouth once a week.  06/10/15  Yes [provider]  aspirin 81 MG tablet Take 81 mg by mouth daily.    Yes [provider]  budesonide-formoterol (SYMBICORT) 160-4.5 MCG/ACT inhaler Inhale 2 puffs into the lungs 2 (two) times daily.     Yes [provider]  calcium carbonate (OS-CAL) 600 MG TABS Take 600 mg by mouth daily.   Yes [provider]  Cholecalciferol (VITAMIN D-3 PO) Take by mouth daily.     Yes [provider]  diltiazem (CARDIZEM CD) 120 MG 24 hr capsule Take 1 capsule (120 mg total) by mouth daily. Further refills to be obtained from PCP 10/23/16  Yes Jerline Pain, MD  esomeprazole ([place]) 20 MG  capsule Take 20 mg by mouth daily at 12 noon.    Yes [provider]  hydrochlorothiazide (HYDRODIURIL) 25 MG tablet Take 25 mg by mouth daily.     Yes [provider]  KLOR-CON 10 10 MEQ tablet Take 10 mEq by mouth daily.  03/12/14  Yes [provider]  losartan (COZAAR) 100 MG tablet Take 100 mg by mouth daily.   Yes [provider]  PROAIR HFA 108 (90 BASE) MCG/ACT inhaler 2 puffs every 4 (four) hours as needed.  06/04/15  Yes [provider]  Respiratory Therapy Supplies (FLUTTER) DEVI Use as directed 02/18/17  Yes Juanito Doom, MD  simvastatin (ZOCOR) 10 MG tablet Take 10 mg by mouth at bedtime.     Yes [provider]   tiotropium (SPIRIVA) 18 MCG inhalation capsule Place 18 mcg into inhaler and inhale daily.     Yes [provider]  traMADol (ULTRAM) 50 MG tablet 1 every 6-8 hours as needed for cough 06/18/17  Yes Chesley Mires, MD  budesonide-formoterol (SYMBICORT) 160-4.5 MCG/ACT inhaler Inhale 2 puffs into the lungs 2 (two) times daily. 08/13/16 02/18/17  Juanito Doom, MD  sulfamethoxazole-trimethoprim (BACTRIM DS,SEPTRA DS) 800-160 MG tablet Take 1 tablet by mouth 2 (two) times daily. 06/21/17 06/28/17  Cambren Helm, PA-C  Tiotropium Bromide Monohydrate (SPIRIVA RESPIMAT) 2.5 MCG/ACT AERS Inhale 2 puffs into the lungs daily. 08/13/16 08/14/16  Juanito Doom, MD    Family History Family History  Problem Relation Age of Onset  . Heart disease Mother   . Cancer Father        prostate  . Heart disease Father   . Cancer Maternal Grandmother        liver    Social History Social History  Substance Use Topics  . Smoking status: Former Smoker    Packs/day: 0.50    Years: 45.00    Types: Cigarettes    Quit date: 10/28/2009  . Smokeless tobacco: Never Used  . Alcohol use 1.2 oz/week    2 Glasses of wine per week     Allergies   Codeine; Evista [raloxifene]; and Procaine hcl   Review of Systems Review of Systems  Constitutional: Negative for chills and fever.  Respiratory: Negative for shortness of breath.   Cardiovascular: Negative for chest pain.  Musculoskeletal: Negative for arthralgias, back pain and joint swelling.  Skin: Positive for color change and wound.       Laceration left lower leg with redness of the leg  Neurological: Negative for dizziness, weakness and numbness.  Hematological: Does not bruise/bleed easily.  All other systems reviewed and are negative.    Physical Exam Updated Vital Signs BP (!) 161/60 (BP Location: Left Arm)   Pulse 71   Temp 98.2 F (36.8 C) (Oral)   Resp 18   Ht 5\' 3"  (1.6 m)   Wt 58.5 kg (129 lb)   SpO2 99%   BMI 22.85 kg/m    Physical Exam  Constitutional: She is oriented to person, place, and time. She appears well-developed and well-nourished. No distress.  HENT:  Head: Normocephalic and atraumatic.  Cardiovascular: Normal rate, regular rhythm and intact distal pulses.   No murmur heard. Pulmonary/Chest: Effort normal and breath sounds normal. No respiratory distress.  Musculoskeletal: Normal range of motion. She exhibits no edema or tenderness.  Pt has FROM of the left knee and ankle.  Compartments are soft.  Neurological: She is alert and oriented to person, place, and time. A  sensory deficit is present. She exhibits normal muscle tone. Coordination normal.  Skin: Skin is warm. Capillary refill takes less than 2 seconds. Laceration noted.  3 cm superficial skin tear to the posterior left lower leg.  Mild to moderate surrounding erythema.  No drainage, FB's, lymphangitis or edema.    Nursing note and vitals reviewed.    ED Treatments / Results  Labs (all labs ordered are listed, but only abnormal results are displayed) Labs Reviewed  BASIC METABOLIC PANEL - Abnormal; Notable for the following:       Result Value   Sodium 133 (*)    Potassium 3.4 (*)    Chloride 95 (*)    Glucose, Bld 116 (*)    All other components within normal limits  CBC WITH DIFFERENTIAL/PLATELET    EKG  EKG Interpretation None       Radiology No results found.  Procedures Procedures (including critical care time)  Medications Ordered in ED Medications  clindamycin (CLEOCIN) IVPB 600 mg (0 mg Intravenous Stopped 06/21/17 1309)     Initial Impression / Assessment and Plan / ED Course  I have reviewed the triage vital signs and the nursing notes.  Pertinent labs & imaging results that were available during my care of the patient were reviewed by me and considered in my medical decision making (see chart for details).     Wound cleaned by me using saline and leading edge of erythema marked by me.   Pt with  superficial skin tear to the lower leg.  NV intact, compartments soft.  No motor deficits.  Likely cellulitis secondary to traumatic wound.  Pt agrees to wound care instructions, d/c the peroxide and polysporin.  Will rx abx and she agrees to wound recheck in 2 days or sooner if worsening.  Final Clinical Impressions(s) / ED Diagnoses   Final diagnoses:  Wound infection    New Prescriptions Discharge Medication List as of 06/21/2017 12:59 PM    START taking these medications   Details  sulfamethoxazole-trimethoprim (BACTRIM DS,SEPTRA DS) 800-160 MG tablet Take 1 tablet by mouth 2 (two) times daily., Starting Mon 06/21/2017, Until Mon 06/28/2017, Print         Kiana Hollar, [place], PA-C 06/24/17 1217    Francine Graven, DO 06/25/17 1547

## 2017-07-29 ENCOUNTER — Other Ambulatory Visit: Payer: Self-pay | Admitting: Pulmonary Disease

## 2017-08-03 NOTE — Telephone Encounter (Signed)
Pt requesting a refill on tramadol. Last filled 06/18/17 #50 with 1 refill, take 1 tab po q6-8h prn cough by VS.    Please advise if ok to refill.  Thanks!

## 2017-08-05 DIAGNOSIS — L905 Scar conditions and fibrosis of skin: Secondary | ICD-10-CM | POA: Diagnosis not present

## 2017-08-05 DIAGNOSIS — L821 Other seborrheic keratosis: Secondary | ICD-10-CM | POA: Diagnosis not present

## 2017-08-05 DIAGNOSIS — D1801 Hemangioma of skin and subcutaneous tissue: Secondary | ICD-10-CM | POA: Diagnosis not present

## 2017-08-05 DIAGNOSIS — D692 Other nonthrombocytopenic purpura: Secondary | ICD-10-CM | POA: Diagnosis not present

## 2017-08-09 NOTE — Telephone Encounter (Signed)
OK to refil

## 2017-08-23 ENCOUNTER — Ambulatory Visit (INDEPENDENT_AMBULATORY_CARE_PROVIDER_SITE_OTHER): Payer: Medicare Other | Admitting: Pulmonary Disease

## 2017-08-23 ENCOUNTER — Encounter: Payer: Self-pay | Admitting: Pulmonary Disease

## 2017-08-23 VITALS — BP 124/58 | HR 69 | Ht 63.0 in | Wt 127.6 lb

## 2017-08-23 DIAGNOSIS — R053 Chronic cough: Secondary | ICD-10-CM

## 2017-08-23 DIAGNOSIS — J479 Bronchiectasis, uncomplicated: Secondary | ICD-10-CM | POA: Diagnosis not present

## 2017-08-23 DIAGNOSIS — R05 Cough: Secondary | ICD-10-CM

## 2017-08-23 MED ORDER — TIOTROPIUM BROMIDE MONOHYDRATE 2.5 MCG/ACT IN AERS: 2.0000 | INHALATION_SPRAY | Freq: Every day | RESPIRATORY_TRACT | 0 refills | Status: DC

## 2017-08-23 MED ORDER — BUDESONIDE-FORMOTEROL FUMARATE 160-4.5 MCG/ACT IN AERO: 2.0000 | INHALATION_SPRAY | Freq: Two times a day (BID) | RESPIRATORY_TRACT | 0 refills | Status: DC

## 2017-08-23 NOTE — Addendum Note (Signed)
Addended by: Len Blalock on: 08/23/2017 03:04 PM   Modules accepted: Orders

## 2017-08-23 NOTE — Patient Instructions (Addendum)
Bronchiectasis: Keep taking your Spiriva and Symbicort as you are doing Keep using the flutter valve as you are doing Get a flu shot  Chronic cough: Keep using the tramadol as you are doing  We will see you back in 6 months or sooner if needed

## 2017-08-23 NOTE — Progress Notes (Signed)
Subjective:    Patient ID: Elizabeth Sparks, female    DOB: March 19, 1941, 76 y.o.   MRN: [id number]  Synopsis: Former patient of Dr. Gwenette Sparks with bronchiectasis and COPD. Had pertussis in forth grade.  Significant smoking history Has chronic cough controlled with tramadol.  HPI Chief Complaint  Patient presents with  . Follow-up    pt states she is doing well, does note sob during extremely humid temps, occasional prod cough with clear/white mucus.    Elizabeth Sparks had a good summer. She says that as long as she stayed out of the midday heat and sun she did OK.  She says that June was rough but she never got hot.  She says that she remains active in her garden working with her vegetables recently.  She goes to [employer].    She is only rarely coughing up much mucus right now. Rare cough.  Tramadol helps. She takes mucinex nightly.  She continues to keep using her flutter valve regularly.     Past Medical History:  Diagnosis Date  . Arthritis   . Atrial fibrillation ([place]) 07/17/14  . Breast cancer ([place])   . Cancer ([place])    right breast  . Complication of anesthesia    has had ilius  . COPD (chronic obstructive pulmonary disease) ([place])   . GERD (gastroesophageal reflux disease)   . History of ongoing treatment with alendronate (Fosamax)   . Hypertension   . S/P radiation therapy comp. 02/10/12   42.5 Gy / 16 Fractions to Right Breast at [facility]  . Shortness of breath   . Thyroid disease   . Wheezing       Review of Systems  Constitutional: Negative for fever and unexpected weight change.  HENT: Negative for congestion, dental problem, ear pain, nosebleeds, postnasal drip, rhinorrhea, sinus pressure, sneezing, sore throat and trouble swallowing.   Eyes: Negative for redness and itching.  Respiratory: Negative for cough, chest tightness, shortness of breath and wheezing.   Cardiovascular: Negative for palpitations and leg swelling.  Skin: Negative for rash.        Objective:   Physical Exam Vitals:   08/23/17 1432  BP: (!) 124/58  Pulse: 69  SpO2: 96%  Weight: 127 lb 9.6 oz (57.9 kg)  Height: 5' 3"  (1.6 m)   RA  Gen: well appearing HENT: OP clear, TM's clear, neck supple PULM: Crackles R base, normal percussion CV: RRR, no mgr, trace edema GI: BS+, soft, nontender Derm: no cyanosis or rash Psyche: normal mood and affect    CBC    Component Value Date/Time   WBC 6.0 06/21/2017 1207   RBC 4.53 06/21/2017 1207   HGB 12.4 06/21/2017 1207   HCT 37.3 06/21/2017 1207   PLT 251 06/21/2017 1207   MCV 82.3 06/21/2017 1207   MCH 27.4 06/21/2017 1207   MCHC 33.2 06/21/2017 1207   RDW 13.5 06/21/2017 1207   LYMPHSABS 1.0 06/21/2017 1207   MONOABS 0.6 06/21/2017 1207   EOSABS 0.1 06/21/2017 1207   BASOSABS 0.0 06/21/2017 1207   Chest imaging: CT chest 2012:  +bronchiectasis esp RUL, + tree and bud c/w MAC colonization.  05/2015 HRCT > bronchiectasis (mild, upper lobe)and mild emphysema  Micro: Resp Cultures 12/2014:  Neg bacteria and neg AFB  PFT: PFT's 2010:  FEV1 1.39 (66%), ratio 63, +airtrapping, no restriction, DLCO 64%     Assessment & Plan:   Bronchiectasis without complication (HCC)  Chronic cough  Discussion: This has been a  stable interval for her, no exacerbations since the last visit.  Plan: Bronchiectasis: Keep taking your Spiriva and Symbicort as you are doing Keep using the flutter valve as you are doing Get a flu shot  Chronic cough: Keep using the tramadol as you are doing  We will see you back in 6 months or sooner if needed    Current Outpatient Prescriptions:  .  alendronate (FOSAMAX) 70 MG tablet, Take 70 mg by mouth once a week. , Disp: , Rfl: 9 .  aspirin 81 MG tablet, Take 81 mg by mouth daily. , Disp: , Rfl:  .  budesonide-formoterol (SYMBICORT) 160-4.5 MCG/ACT inhaler, Inhale 2 puffs into the lungs 2 (two) times daily.  , Disp: , Rfl:  .  calcium carbonate (OS-CAL) 600 MG TABS,  Take 600 mg by mouth daily., Disp: , Rfl:  .  Cholecalciferol (VITAMIN D-3 PO), Take by mouth daily.  , Disp: , Rfl:  .  diltiazem (CARDIZEM CD) 120 MG 24 hr capsule, Take 1 capsule (120 mg total) by mouth daily. Further refills to be obtained from PCP, Disp: 90 capsule, Rfl: 0 .  esomeprazole (NEXIUM) 20 MG capsule, Take 20 mg by mouth daily at 12 noon. , Disp: , Rfl:  .  hydrochlorothiazide (HYDRODIURIL) 25 MG tablet, Take 25 mg by mouth daily.  , Disp: , Rfl:  .  KLOR-CON 10 10 MEQ tablet, Take 10 mEq by mouth daily. , Disp: , Rfl:  .  losartan (COZAAR) 100 MG tablet, Take 100 mg by mouth daily., Disp: , Rfl:  .  PROAIR HFA 108 (90 BASE) MCG/ACT inhaler, 2 puffs every 4 (four) hours as needed. , Disp: , Rfl: 4 .  Respiratory Therapy Supplies (FLUTTER) DEVI, Use as directed, Disp: 1 each, Rfl: 0 .  simvastatin (ZOCOR) 10 MG tablet, Take 10 mg by mouth at bedtime.  , Disp: , Rfl:  .  Tiotropium Bromide Monohydrate (SPIRIVA RESPIMAT) 2.5 MCG/ACT AERS, Inhale 2 puffs into the lungs daily., Disp: 2 Inhaler, Rfl: 0 .  traMADol (ULTRAM) 50 MG tablet, TAKE ONE TABLET BY MOUTH EVERY 6 TO 8 HOURS AS NEEDED FOR COUGH, Disp: 50 tablet, Rfl: 1

## 2017-08-24 DIAGNOSIS — R309 Painful micturition, unspecified: Secondary | ICD-10-CM | POA: Diagnosis not present

## 2017-08-24 DIAGNOSIS — N3001 Acute cystitis with hematuria: Secondary | ICD-10-CM | POA: Diagnosis not present

## 2017-08-24 DIAGNOSIS — R3129 Other microscopic hematuria: Secondary | ICD-10-CM | POA: Diagnosis not present

## 2017-08-24 DIAGNOSIS — R3915 Urgency of urination: Secondary | ICD-10-CM | POA: Diagnosis not present

## 2017-09-02 DIAGNOSIS — R112 Nausea with vomiting, unspecified: Secondary | ICD-10-CM | POA: Diagnosis not present

## 2017-09-02 DIAGNOSIS — N393 Stress incontinence (female) (male): Secondary | ICD-10-CM | POA: Diagnosis not present

## 2017-09-02 DIAGNOSIS — R300 Dysuria: Secondary | ICD-10-CM | POA: Diagnosis not present

## 2017-09-02 DIAGNOSIS — K521 Toxic gastroenteritis and colitis: Secondary | ICD-10-CM | POA: Diagnosis not present

## 2017-09-20 ENCOUNTER — Other Ambulatory Visit: Payer: Self-pay | Admitting: Internal Medicine

## 2017-09-20 DIAGNOSIS — Z853 Personal history of malignant neoplasm of breast: Secondary | ICD-10-CM

## 2017-10-12 DIAGNOSIS — Z0000 Encounter for general adult medical examination without abnormal findings: Secondary | ICD-10-CM | POA: Diagnosis not present

## 2017-10-12 DIAGNOSIS — M810 Age-related osteoporosis without current pathological fracture: Secondary | ICD-10-CM | POA: Diagnosis not present

## 2017-10-12 DIAGNOSIS — Z1389 Encounter for screening for other disorder: Secondary | ICD-10-CM | POA: Diagnosis not present

## 2017-10-12 DIAGNOSIS — I2510 Atherosclerotic heart disease of native coronary artery without angina pectoris: Secondary | ICD-10-CM | POA: Diagnosis not present

## 2017-11-11 ENCOUNTER — Ambulatory Visit
Admission: RE | Admit: 2017-11-11 | Discharge: 2017-11-11 | Disposition: A | Discharge status: Home / Self Care | Payer: Medicare Other | Source: Ambulatory Visit | Attending: Internal Medicine | Admitting: Internal Medicine

## 2017-11-11 DIAGNOSIS — R928 Other abnormal and inconclusive findings on diagnostic imaging of breast: Secondary | ICD-10-CM | POA: Diagnosis not present

## 2017-11-11 DIAGNOSIS — Z853 Personal history of malignant neoplasm of breast: Secondary | ICD-10-CM

## 2017-11-16 ENCOUNTER — Other Ambulatory Visit: Payer: Self-pay | Admitting: Pulmonary Disease

## 2017-11-16 NOTE — Telephone Encounter (Signed)
Pharmacy is asking for a refill on patient's Tramadol.   She was last seen on 08/23/17.   Last RX was written on 08/10/17 for 50 tabs with 1 refill.   Next OV has not been scheduled yet.   BQ, please advise if this refill is appropriate. Thanks!

## 2017-11-29 ENCOUNTER — Encounter: Payer: Self-pay | Admitting: Internal Medicine

## 2017-11-29 ENCOUNTER — Telehealth: Payer: Self-pay | Admitting: Pulmonary Disease

## 2017-11-29 ENCOUNTER — Ambulatory Visit: Payer: Medicare Other | Admitting: Internal Medicine

## 2017-11-29 ENCOUNTER — Ambulatory Visit (INDEPENDENT_AMBULATORY_CARE_PROVIDER_SITE_OTHER)
Admission: RE | Admit: 2017-11-29 | Discharge: 2017-11-29 | Disposition: A | Discharge status: Home / Self Care | Payer: Medicare Other | Source: Ambulatory Visit | Attending: Internal Medicine | Admitting: Internal Medicine

## 2017-11-29 VITALS — BP 108/68 | HR 78 | Temp 97.2°F | Ht 63.0 in | Wt 126.2 lb

## 2017-11-29 DIAGNOSIS — J449 Chronic obstructive pulmonary disease, unspecified: Secondary | ICD-10-CM | POA: Diagnosis not present

## 2017-11-29 DIAGNOSIS — R0602 Shortness of breath: Secondary | ICD-10-CM | POA: Diagnosis not present

## 2017-11-29 DIAGNOSIS — J470 Bronchiectasis with acute lower respiratory infection: Secondary | ICD-10-CM | POA: Diagnosis not present

## 2017-11-29 DIAGNOSIS — R05 Cough: Secondary | ICD-10-CM | POA: Diagnosis not present

## 2017-11-29 MED ORDER — AMOXICILLIN-POT CLAVULANATE 875-125 MG PO TABS: 1.0000 | ORAL_TABLET | Freq: Two times a day (BID) | ORAL | 0 refills | Status: AC

## 2017-11-29 NOTE — Progress Notes (Signed)
Subjective:    Patient ID: Elizabeth Sparks, female    DOB: September 11, 1941 .   MRN: [id number]  Synopsis: Former patient of Dr. Gwenette Greet with bronchiectasis and COPD GOLD II criteria s/p smoking cessation 2010 Had pertussis in forth grade.  Has chronic cough controlled with tramadol.    Ov with McQuaid 08/23/17  RECS Bronchiectasis: Keep taking your Spiriva and Symbicort as you are doing Keep using the flutter valve as you are doing Get a flu shot  Chronic cough: Keep using the tramadol as you are doing  We will see you back in 6 months or sooner if needed   11/29/2017 acute extended ov/Chandy Tarman re:  Cough flare  Chief Complaint  Patient presents with  . Acute Visit    Pt c/o cough and hoarseness for the past 3 days. Before this started she had "tickle" in her throat for approx 1 wk. Cough is prod with greyish green sputum. She has also noticed occ wheezing and chest tightness. She has not had to use her rescue inhaler.        More limited by joints than breathing at baselin, yhen acutely ill with "the usual" with severe hoarseness/ sore throat on nexium 20 mg  After bfast and mucus turning thicker / greener 24/7 hacking cough disturbing sleep with assoc wheeze/ chest tightness and sob walking more than room to room  Has only used saba once or twice, not as neb since onset of flare/ has flutter not sure it's working correctly/ not really  Helping.  On fosfamax x 6 years with dg Es Significant gastroesophageal reflux to level of the upper thoracic esophagus with change in patient position.   No obvious day to day or daytime variability or assoc   mucus plugs or hemoptysis or cp or   or overt sinus or hb symptoms. No unusual exposure hx or h/o childhood  asthma or knowledge of premature birth.   . Also denies any obvious fluctuation of symptoms with weather or environmental changes or other aggravating or alleviating factors except as outlined above   Current Allergies, Complete Past  Medical History, Past Surgical History, Family History, and Social History were reviewed in [employer] record.  ROS  The following are not active complaints unless bolded Hoarseness, sore throat, dysphagia, dental problems, itching, sneezing,  nasal congestion or discharge of excess mucus or purulent secretions, ear ache,   fever, chills, sweats, unintended wt loss or wt gain, classically pleuritic or exertional cp,  orthopnea pnd or leg swelling, presyncope, palpitations, abdominal pain, anorexia, nausea, vomiting, diarrhea  or change in bowel habits or change in bladder habits, change in stools or change in urine, dysuria, hematuria,  rash, arthralgias, visual complaints, headache, numbness, weakness or ataxia or problems with walking or coordination,  change in mood/affect or memory.        Current Meds  Medication Sig  . alendronate (FOSAMAX) 70 MG tablet Take 70 mg by mouth once a week.   Marland Kitchen aspirin 81 MG tablet Take 81 mg by mouth daily.   . budesonide-formoterol (SYMBICORT) 160-4.5 MCG/ACT inhaler Inhale 2 puffs into the lungs 2 (two) times daily.    . calcium carbonate (OS-CAL) 600 MG TABS Take 600 mg by mouth daily.  . Cholecalciferol (VITAMIN D-3 PO) Take by mouth daily.    Marland Kitchen diltiazem (CARDIZEM CD) 120 MG 24 hr capsule Take 1 capsule (120 mg total) by mouth daily. Further refills to be obtained from PCP  . esomeprazole ([place])  20 MG capsule Take 20 mg by mouth daily at 12 noon.   . hydrochlorothiazide (HYDRODIURIL) 25 MG tablet Take 25 mg by mouth daily.    Marland Kitchen KLOR-CON 10 10 MEQ tablet Take 10 mEq by mouth daily.   Marland Kitchen losartan (COZAAR) 100 MG tablet Take 100 mg by mouth daily.  Marland Kitchen PROAIR HFA 108 (90 BASE) MCG/ACT inhaler 2 puffs every 4 (four) hours as needed.   Marland Kitchen Respiratory Therapy Supplies (FLUTTER) DEVI Use as directed  . simvastatin (ZOCOR) 10 MG tablet Take 10 mg by mouth at bedtime.    . Tiotropium Bromide Monohydrate (SPIRIVA RESPIMAT) 2.5 MCG/ACT AERS  Inhale 2 puffs into the lungs daily.  . traMADol (ULTRAM) 50 MG tablet TAKE ONE TABLET BY MOUTH EVERY 6 TO 8 HOURS AS NEEDED FOR COUGH                  Objective:   Physical Exam    thin uncomfortable appearing hoarse wf nad/ no increased wob  Wt Readings from Last 3 Encounters:  11/29/17 126 lb 3.2 oz (57.2 kg)  08/23/17 127 lb 9.6 oz (57.9 kg)  06/21/17 129 lb (58.5 kg)     Vital signs reviewed - Note on arrival 02 sats  97% on RA   HEENT: nl dentition, turbinates bilaterally, and oropharynx. Nl external ear canals without cough reflex   NECK :  without JVD/Nodes/TM/ nl carotid upstrokes bilaterally   LUNGS: no acc muscle use,  slt barrel chest with insp and exp distant rhonchi bilaterally    CV:  RRR  no s3 or murmur or increase in P2, and no edema   ABD:  soft and nontender with nl inspiratory excursion in the supine position. No bruits or organomegaly appreciated, bowel sounds nl  MS:  Nl gait/ ext warm without deformities, calf tenderness, cyanosis or clubbing No obvious joint restrictions   SKIN: warm and dry without lesions    NEURO:  alert, approp, nl sensorium with  no motor or cerebellar deficits apparent.        CXR PA and Lateral:   11/29/2017 :    I personally reviewed images and  impression as follows:   Hyper inflated, coarse marking unchanged, no as dz       Assessment & Plan:

## 2017-11-29 NOTE — Telephone Encounter (Signed)
Called and spoke with pt. Pt reports of prod cough with brown to green mucus, wheezing & chest tightness.  Pt has been scheduled for acute visit with MW today at 4:30. Nothing further is needed.

## 2017-11-29 NOTE — Patient Instructions (Addendum)
Augmentin 875 mg take one pill twice daily  X 10 days - take at breakfast and supper with large glass of water.  It would help reduce the usual side effects (diarrhea and yeast infections) if you ate cultured yogurt at lunch.   For cough > mucinex dm to 1200 mg every 12 hours and use the flutter valve as much you can     Nexium 20 mg x 2 Take 30- 60 min before your first and last meals of the day   GERD (REFLUX)  is an extremely common cause of respiratory symptoms just like yours , many times with no obvious heartburn at all.    It can be treated with medication, but also with lifestyle changes including elevation of the head of your bed (ideally with 6 inch  bed blocks),  Smoking cessation, avoidance of late meals, excessive alcohol, and avoid fatty foods, chocolate, peppermint, colas, red wine, and acidic juices such as orange juice.  NO MINT OR MENTHOL PRODUCTS SO NO COUGH DROPS   USE SUGARLESS CANDY INSTEAD (Jolley ranchers or Stover's or Life Savers) or even ice chips will also do - the key is to swallow to prevent all throat clearing. NO OIL BASED VITAMINS - use powdered substitutes.   Please remember to go to the  x-ray department downstairs in the basement  for your tests - we will call you with the results when they are available Late add; Stop fosfamax until 100% improved cough (without the need to control with tramadol).       Marland Kitchen

## 2017-11-30 ENCOUNTER — Encounter: Payer: Self-pay | Admitting: Internal Medicine

## 2017-11-30 NOTE — Assessment & Plan Note (Signed)
CT chest 2012:  +bronchiectasis esp RUL, + tree and bud c/w MAC colonization.  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  While not intuitively obvious, many patients with chronic low grade reflux do not cough until there is a primary insult that disturbs the protective epithelial barrier and exposes sensitive nerve endings.   This is typically viral as was probably initially the case here but can be direct physical injury such as with an endotracheal tube.   The point is that once this occurs, it is difficult to eliminate the cycle  using anything but a maximally effective acid suppression regimen at least in the short run, accompanied by an appropriate diet to address non acid GERD and control / eliminate the cough itself for at least 3 days.   Rec augmentin/ max gerd rx/ use flutter valve appropriately / hold fosfamax until 100 % back to baseline   I had an extended discussion with the patient reviewing all relevant studies completed to date and  lasting 25 minutes of a 40  minute acute office  visit with pt new to me     re  severe non-specific but potentially very serious refractory respiratory symptoms of uncertain and potentially multiple  etiologies.   Each maintenance medication was reviewed in detail including most importantly the difference between maintenance and prns and under what circumstances the prns are to be triggered using an action plan format that is not reflected in the computer generated alphabetically organized AVS.    Please see AVS for specific instructions unique to this office visit that I personally wrote and verbalized to the  the pt in detail and then reviewed with pt  by my nurse highlighting any changes in therapy/plan of care  recommended at today's visit.

## 2017-11-30 NOTE — Progress Notes (Signed)
Spoke with pt and notified of results per Dr. Melvyn Novas. Pt verbalized understanding and denied any questions. Appt with MW scheduled for 12/31/17 at 10 am

## 2017-11-30 NOTE — Assessment & Plan Note (Signed)
-   The proper method of use, as well as anticipated side effects, of a metered-dose inhaler and flutter valve were  discussed and demonstrated to the patient.   The actual copd component appears to be well compensated at baseline and only mildly flaring so no change in maint rx/ reviewed contingency use of saba hfa/ neb

## 2017-12-08 DIAGNOSIS — M810 Age-related osteoporosis without current pathological fracture: Secondary | ICD-10-CM | POA: Diagnosis not present

## 2017-12-08 DIAGNOSIS — M8588 Other specified disorders of bone density and structure, other site: Secondary | ICD-10-CM | POA: Diagnosis not present

## 2017-12-31 ENCOUNTER — Ambulatory Visit: Payer: Medicare Other | Admitting: Internal Medicine

## 2018-01-03 ENCOUNTER — Ambulatory Visit (INDEPENDENT_AMBULATORY_CARE_PROVIDER_SITE_OTHER)
Admission: RE | Admit: 2018-01-03 | Discharge: 2018-01-03 | Disposition: A | Discharge status: Home / Self Care | Payer: Medicare Other | Source: Ambulatory Visit | Attending: Internal Medicine | Admitting: Internal Medicine

## 2018-01-03 ENCOUNTER — Encounter: Payer: Self-pay | Admitting: Internal Medicine

## 2018-01-03 ENCOUNTER — Ambulatory Visit: Payer: Medicare Other | Admitting: Internal Medicine

## 2018-01-03 VITALS — BP 132/62 | HR 88 | Ht 63.0 in | Wt 126.6 lb

## 2018-01-03 DIAGNOSIS — J449 Chronic obstructive pulmonary disease, unspecified: Secondary | ICD-10-CM

## 2018-01-03 DIAGNOSIS — J470 Bronchiectasis with acute lower respiratory infection: Secondary | ICD-10-CM | POA: Diagnosis not present

## 2018-01-03 DIAGNOSIS — R053 Chronic cough: Secondary | ICD-10-CM

## 2018-01-03 DIAGNOSIS — R05 Cough: Secondary | ICD-10-CM

## 2018-01-03 DIAGNOSIS — J189 Pneumonia, unspecified organism: Secondary | ICD-10-CM | POA: Diagnosis not present

## 2018-01-03 MED ORDER — TRAMADOL HCL 50 MG PO TABS: ORAL_TABLET | ORAL | 0 refills | Status: DC

## 2018-01-03 NOTE — Patient Instructions (Addendum)
Leave off fosfamax for next 3 months  To see what effect if any it has on your cough   GERD (REFLUX)  is an extremely common cause of respiratory symptoms just like yours , many times with no obvious heartburn at all.    It can be treated with medication, but also with lifestyle changes including elevation of the head of your bed (ideally with 6 inch  bed blocks),  Smoking cessation, avoidance of late meals, excessive alcohol, and avoid fatty foods, chocolate, peppermint, colas, red wine, and acidic juices such as orange juice.  NO MINT OR MENTHOL PRODUCTS SO NO COUGH DROPS   USE SUGARLESS CANDY INSTEAD (Jolley ranchers or Stover's or Life Savers) or even ice chips will also do - the key is to swallow to prevent all throat clearing. NO OIL BASED VITAMINS - use powdered substitutes.    Please schedule a follow up visit in 3 months but call sooner if needed with DR Lake Bells

## 2018-01-03 NOTE — Assessment & Plan Note (Signed)
+  irritable larynx syndrome Better with tramadol Gabapentin trial 12/03/2014 >> no significant benefit - Dg Es 07/15/15 Significant gastroesophageal reflux to level of the upper thoracic esophagus with change in patient position.  - trial off fosfamax 01/03/2018 >>> f/u in 3 months with Dr Lake Bells

## 2018-01-03 NOTE — Progress Notes (Signed)
Subjective:    Patient ID: Elizabeth Sparks, female    DOB: 1941-12-14 .   MRN: [id number]  Synopsis: Former patient of Dr. Gwenette Greet with bronchiectasis  Quit smoking 2010  COPD GOLD II criteria   Had pertussis in forth grade.  Has chronic cough controlled with tramadol.    Ov with McQuaid 08/23/17  RECS Bronchiectasis: Keep taking your Spiriva and Symbicort as you are doing Keep using the flutter valve as you are doing Get a flu shot  Chronic cough: Keep using the tramadol as you are doing     11/29/2017 acute extended ov/Elizabeth Sparks re:  Cough flare  Chief Complaint  Patient presents with  . Acute Visit    Pt c/o cough and hoarseness for the past 3 days. Before this started she had "tickle" in her throat for approx 1 wk. Cough is prod with greyish green sputum. She has also noticed occ wheezing and chest tightness. She has not had to use her rescue inhaler.    More limited by joints than breathing at baseline, then acutely ill with "the usual" with severe hoarseness/ sore throat on nexium 20 mg  After bfast and mucus turning thicker / greener 24/7 hacking cough disturbing sleep with assoc wheeze/ chest tightness and sob walking more than room to room only used saba once or twice, not as neb since onset of flare/ has flutter not sure it's working correctly/ not really  Helping. On fosfamax x 6 years with dg Es Significant gastroesophageal reflux to level of the upper thoracic esophagus with change in patient position. rec Augmentin 875 mg take one pill twice daily  X 10 days   For cough > mucinex dm to 1200 mg every 12 hours and use the flutter valve as much you can  Nexium 20 mg x 2 Take 30- 60 min before your first and last meals of the day  GERD (REFLUX) diet  Please remember to go to the  x-ray department downstairs in the basement  for your tests - we will call you with the results when they are available Late add; Stop fosfamax until 100% improved cough (without the need to  control with tramadol).    01/03/2018  f/u ov/Elizabeth Sparks re: s/p bronchiectasis flare/ chronic cough still tramadol dep at hs  Chief Complaint  Patient presents with  . Follow-up    CXR repeated.  Breathing is doing better and she is coughing less. She is asking for refill on tramadol.  She has not had to use her albuterol.    doe improved MMRC1 = can walk nl pace, flat grade, can't hurry or go uphills or steps s sob   Wakes up feels rested s noct or am exac if takes tramadol and takes one pretty much every night  Not needing any saba/ does use the mucinex dm/ flutter and max gerd rx and not aware of hb  No obvious day to day or daytime variability or assoc excess/ purulent sputum or mucus plugs or hemoptysis or cp or chest tightness, subjective wheeze or overt sinus or hb symptoms. No unusual exposure hx or h/o childhood pna/ asthma or knowledge of premature birth.  Sleeping ok 2 pillows  without nocturnal  or early am exacerbation  of respiratory  c/o's or need for noct saba. Also denies any obvious fluctuation of symptoms with weather or environmental changes or other aggravating or alleviating factors except as outlined above   Current Allergies, Complete Past Medical History, Past Surgical History, Family  History, and Social History were reviewed in [employer] record.  ROS  The following are not active complaints unless bolded Hoarseness, sore throat, dysphagia, dental problems, itching, sneezing,  nasal congestion or discharge of excess mucus or purulent secretions, ear ache,   fever, chills, sweats, unintended wt loss or wt gain, classically pleuritic or exertional cp,  orthopnea pnd or leg swelling, presyncope, palpitations, abdominal pain, anorexia, nausea, vomiting, diarrhea  or change in bowel habits or change in bladder habits, change in stools or change in urine, dysuria, hematuria,  rash, arthralgias, visual complaints, headache, numbness, weakness or ataxia or  problems with walking or coordination,  change in mood/affect or memory.        Current Meds  Medication Sig  . aspirin 81 MG tablet Take 81 mg by mouth daily.   . budesonide-formoterol (SYMBICORT) 160-4.5 MCG/ACT inhaler Inhale 2 puffs into the lungs 2 (two) times daily.    . calcium carbonate (OS-CAL) 600 MG TABS Take 600 mg by mouth daily.  . Cholecalciferol (VITAMIN D-3 PO) Take by mouth daily.    Marland Kitchen diltiazem (CARDIZEM CD) 120 MG 24 hr capsule Take 1 capsule (120 mg total) by mouth daily. Further refills to be obtained from PCP  . esomeprazole (NEXIUM) 20 MG capsule Take 40 mg by mouth 2 (two) times daily before a meal.   . hydrochlorothiazide (HYDRODIURIL) 25 MG tablet Take 25 mg by mouth daily.    Marland Kitchen KLOR-CON 10 10 MEQ tablet Take 10 mEq by mouth daily.   Marland Kitchen losartan (COZAAR) 100 MG tablet Take 100 mg by mouth daily.  Marland Kitchen PROAIR HFA 108 (90 BASE) MCG/ACT inhaler 2 puffs every 4 (four) hours as needed.   Marland Kitchen Respiratory Therapy Supplies (FLUTTER) DEVI Use as directed  . simvastatin (ZOCOR) 10 MG tablet Take 10 mg by mouth at bedtime.    . traMADol (ULTRAM) 50 MG tablet One at bedtime if needed  . [DISCONTINUED] alendronate (FOSAMAX) 70 MG tablet Take 70 mg by mouth once a week.   . [DISCONTINUED] traMADol (ULTRAM) 50 MG tablet TAKE ONE TABLET BY MOUTH EVERY 6 TO 8 HOURS AS NEEDED FOR COUGH              Objective:   Physical Exam   Mildly hoarse wf nad     01/03/2018       126   11/29/17 126 lb 3.2 oz (57.2 kg)  08/23/17 127 lb 9.6 oz (57.9 kg)  06/21/17 129 lb (58.5 kg)     Vital signs reviewed - Note on arrival 02 sats  99% on RA          slt barrel chest with insp and exp distant rhonchi bilaterally    HEENT: nl dentition, turbinates bilaterally, and oropharynx. Nl external ear canals without cough reflex   NECK :  without JVD/Nodes/TM/ nl carotid upstrokes bilaterally   LUNGS: no acc muscle use,  Nl contour chest with slt coarsening of bs on insp/ exp bilaterally  s true wheeze    CV:  RRR  no s3 or murmur or increase in P2, and no edema   ABD:  soft and nontender with nl inspiratory excursion in the supine position. No bruits or organomegaly appreciated, bowel sounds nl  MS:  Nl gait/ ext warm without deformities, calf tenderness, cyanosis or clubbing No obvious joint restrictions   SKIN: warm and dry without lesions    NEURO:  alert, approp, nl sensorium with  no motor or cerebellar deficits  apparent.    CXR PA and Lateral:   01/03/2018 :    I personally reviewed images and agree with radiology impression as follows:    Chronic bronchitic changes. Stable patchy airspace opacities on the right. No acute pneumonia nor CHF. My review:  No def as opacity/ more linear pattern now than prior cxr               Assessment & Plan:

## 2018-01-03 NOTE — Assessment & Plan Note (Signed)
PFT's 2010:  FEV1 1.39 (66%), ratio 63, +airtrapping, no restriction, DLCO 64% Significant smoking history s/p cessation in 2010  - 11/29/17   After extensive coaching inhaler device  effectiveness =  75%    Adequate control on present rx, reviewed in detail with pt > no change in rx needed

## 2018-01-03 NOTE — Assessment & Plan Note (Addendum)
CT chest 2012:  +bronchiectasis esp RUL, + tree and bud c/w MAC colonization.  05/2015 HRCT > bronchiectasis (mild, upper lobe)and mild emphysema - Dg Es 07/15/15 Significant gastroesophageal reflux to level of the upper thoracic esophagus with change in patient position. - Rec 01/03/2018 hold fosfamax x 3 months to see if reduces tramadol dep   Clearly improved over last ov but still on  tramadol qhs (? If really needs it) and back on fosfamax as well as high dose gerd rx so rec 3 months off (has been on for 6 years to see what symptoms if any improve and whether she remains tramadol dep at hs / no further abx for now   Also reviewed noct rec for bed blocks and appropriate use of the flutter valve and Mucinex dm.  I had an extended discussion with the patient reviewing all relevant studies completed to date and  lasting 15 to 20 minutes of a 25 minute visit    Each maintenance medication was reviewed in detail including most importantly the difference between maintenance and prns and under what circumstances the prns are to be triggered using an action plan format that is not reflected in the computer generated alphabetically organized AVS.    Please see AVS for specific instructions unique to this visit that I personally wrote and verbalized to the the pt in detail and then reviewed with pt  by my nurse highlighting any  changes in therapy recommended at today's visit to their plan of care.

## 2018-01-25 DIAGNOSIS — H40033 Anatomical narrow angle, bilateral: Secondary | ICD-10-CM | POA: Diagnosis not present

## 2018-01-25 DIAGNOSIS — H2513 Age-related nuclear cataract, bilateral: Secondary | ICD-10-CM | POA: Diagnosis not present

## 2018-03-10 DIAGNOSIS — H25812 Combined forms of age-related cataract, left eye: Secondary | ICD-10-CM | POA: Diagnosis not present

## 2018-03-24 ENCOUNTER — Ambulatory Visit: Payer: Medicare Other | Admitting: Pulmonary Disease

## 2018-03-24 DIAGNOSIS — H25811 Combined forms of age-related cataract, right eye: Secondary | ICD-10-CM | POA: Diagnosis not present

## 2018-04-04 ENCOUNTER — Telehealth: Payer: Self-pay | Admitting: Pulmonary Disease

## 2018-04-04 MED ORDER — TRAMADOL HCL 50 MG PO TABS: ORAL_TABLET | ORAL | 0 refills | Status: DC

## 2018-04-04 NOTE — Telephone Encounter (Signed)
Spoke with pt. She is aware that Dr. Melvyn Novas is okay with refilling this prescription. Rx has been called in. Nothing further was needed.

## 2018-04-04 NOTE — Telephone Encounter (Signed)
Dr Lake Bells patient last seen 1.21.19 by Dr Melvyn Novas for sick visit Tramadol 50mg  was given at ov #90 QHS as needed Pt is scheduled to see Dr Lake Bells on 5.14.19  Called spoke with patient who reports that she has been out of the Tramadol x3 days and has noticed that her dry cough has returned.  Since her last visit with Dr Melvyn Novas, patient has been taking 1 Tramadol 50mg  every evening and reported no cough while taking this.  Pt has also held her Fosamax as instructed at last visit and feels that it was contributing to her cough.  Patient is scheduled for cataract removal on this Thursday 4.25.19 and is requesting Rx prior to this procedure so she won't cough.  Dr Lake Bells is out of the office this week Routing to Dr Melvyn Novas since he rx'd the Tramadol and saw patient last [facility]  Please advise, thank you  Per 1.21.19 office visit with MW: Patient Instructions  Leave off fosfamax for next 3 months  To see what effect if any it has on your cough    GERD (REFLUX)  is an extremely common cause of respiratory symptoms just like yours , many times with no obvious heartburn at all.     It can be treated with medication, but also with lifestyle changes including elevation of the head of your bed (ideally with 6 inch  bed blocks),  Smoking cessation, avoidance of late meals, excessive alcohol, and avoid fatty foods, chocolate, peppermint, colas, red wine, and acidic juices such as orange juice.  NO MINT OR MENTHOL PRODUCTS SO NO COUGH DROPS   USE SUGARLESS CANDY INSTEAD (Jolley ranchers or Stover's or Life Savers) or even ice chips will also do - the key is to swallow to prevent all throat clearing. NO OIL BASED VITAMINS - use powdered substitutes.     Please schedule a follow up visit in 3 months but call sooner if needed with DR Lake Bells

## 2018-04-04 NOTE — Telephone Encounter (Signed)
Ok to refill tramadol (once) same # and sig

## 2018-04-07 DIAGNOSIS — H25812 Combined forms of age-related cataract, left eye: Secondary | ICD-10-CM | POA: Diagnosis not present

## 2018-04-14 DIAGNOSIS — H04123 Dry eye syndrome of bilateral lacrimal glands: Secondary | ICD-10-CM | POA: Diagnosis not present

## 2018-04-23 DIAGNOSIS — S81812A Laceration without foreign body, left lower leg, initial encounter: Secondary | ICD-10-CM | POA: Diagnosis not present

## 2018-04-26 ENCOUNTER — Encounter: Payer: Self-pay | Admitting: Pulmonary Disease

## 2018-04-26 ENCOUNTER — Ambulatory Visit: Payer: Medicare Other | Admitting: Pulmonary Disease

## 2018-04-26 VITALS — BP 142/60 | HR 70 | Ht 63.0 in | Wt 126.0 lb

## 2018-04-26 DIAGNOSIS — R053 Chronic cough: Secondary | ICD-10-CM

## 2018-04-26 DIAGNOSIS — K219 Gastro-esophageal reflux disease without esophagitis: Secondary | ICD-10-CM

## 2018-04-26 DIAGNOSIS — J470 Bronchiectasis with acute lower respiratory infection: Secondary | ICD-10-CM

## 2018-04-26 DIAGNOSIS — M810 Age-related osteoporosis without current pathological fracture: Secondary | ICD-10-CM

## 2018-04-26 DIAGNOSIS — R05 Cough: Secondary | ICD-10-CM | POA: Diagnosis not present

## 2018-04-26 NOTE — Patient Instructions (Signed)
Osteoporosis: There are alternatives to Fosamax, I will try to send a note to primary care physician to consider these  Bronchiectasis: Continue Symbicort 2 puffs twice a day Continue using the flutter valve 10 breaths twice a day Use albuterol as needed for chest tightness wheezing or shortness of breath Call us if you have an increase in mucus production, chest tightness wheezing or shortness of breath  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

## 2018-04-26 NOTE — Progress Notes (Signed)
Subjective:    Patient ID: Elizabeth Sparks, female    DOB: Aug 12, 1941, 77 y.o.   MRN: [id number]  Synopsis: Former patient of Dr. Gwenette Greet with bronchiectasis and COPD. Had pertussis in forth grade.  Significant smoking history Has chronic cough controlled with tramadol.  HPI Chief Complaint  Patient presents with  . Follow-up    3 month ROV    She is on an antibiotic for some leg cellulitis right now.  When the pollen was rampant she say sthe breathing was worse: chest tightness, increased chest congestion.  Her mucus production is stable right now.  The mucus is typically light green in color.  She still has a dry tickling cough that the tramadol helps.  She uses her flutter valve sparingly, only as needed.  Not daily.     She held fosamax which helped her cough some.  She still feels like there is something in her throat.  She also increased her Nexium to 4 twice a day.    Still on symbicort.    Past Medical History:  Diagnosis Date  . Arthritis   . Atrial fibrillation ([place]) 07/17/14  . Breast cancer ([place])   . Cancer ([place])    right breast  . Complication of anesthesia    has had ilius  . COPD (chronic obstructive pulmonary disease) ([place])   . GERD (gastroesophageal reflux disease)   . History of ongoing treatment with alendronate (Fosamax)   . Hypertension   . S/P radiation therapy comp. 02/10/12   42.5 Gy / 16 Fractions to Right Breast at [facility]  . Shortness of breath   . Thyroid disease   . Wheezing       Review of Systems  Constitutional: Negative for fever and unexpected weight change.  HENT: Negative for congestion, dental problem, ear pain, nosebleeds, postnasal drip, rhinorrhea, sinus pressure, sneezing, sore throat and trouble swallowing.   Eyes: Negative for redness and itching.  Respiratory: Negative for cough, chest tightness, shortness of breath and wheezing.   Cardiovascular: Negative for palpitations and leg swelling.  Skin:  Negative for rash.       Objective:   Physical Exam Vitals:   04/26/18 0952  BP: (!) 142/60  Pulse: 70  SpO2: 96%  Weight: 126 lb (57.2 kg)  Height: 5' 3"  (1.6 m)   RA  Gen: well appearing HENT: OP clear, neck supple PULM: CTA B, normal percussion CV: RRR, no mgr, trace edema GI: BS+, soft, nontender Derm: no cyanosis or rash Psyche: normal mood and affect    CBC    Component Value Date/Time   WBC 6.0 06/21/2017 1207   RBC 4.53 06/21/2017 1207   HGB 12.4 06/21/2017 1207   HCT 37.3 06/21/2017 1207   PLT 251 06/21/2017 1207   MCV 82.3 06/21/2017 1207   MCH 27.4 06/21/2017 1207   MCHC 33.2 06/21/2017 1207   RDW 13.5 06/21/2017 1207   LYMPHSABS 1.0 06/21/2017 1207   MONOABS 0.6 06/21/2017 1207   EOSABS 0.1 06/21/2017 1207   BASOSABS 0.0 06/21/2017 1207   Chest imaging: CT chest 2012:  +bronchiectasis esp RUL, + tree and bud c/w MAC colonization.  05/2015 HRCT > bronchiectasis (mild, upper lobe)and mild emphysema Esophagram 2016 showed gastroesophageal reflux to the level of the upper thoracic esophagus  Micro: Resp Cultures 12/2014:  Neg bacteria and neg AFB  PFT: PFT's 2010:  FEV1 1.39 (66%), ratio 63, +airtrapping, no restriction, DLCO 64%  Records from her visit with Dr. Melvyn Novas  in January reviewed where Fosamax was held due to gastroesophageal reflux and cough.     Assessment & Plan:   Acute bronchitis with bronchiectasis (HCC)  Chronic cough  Gastroesophageal reflux disease, esophagitis presence not specified  Osteoporosis, unspecified osteoporosis type, unspecified pathological fracture presence  Discussion: This has been a stable interval for [name].  The increased dose of Nexium and holding Fosamax seems to have helped some with her cough.  No exacerbations of bronchiectasis recently.  She has not been compliant with mucociliary clearance measures though so we talked about the importance of that today.  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  aspirin 81 MG tablet, Take 81 mg by mouth daily. , Disp: , Rfl:  .  budesonide-formoterol (SYMBICORT) 160-4.5 MCG/ACT inhaler, Inhale 2 puffs into the lungs 2 (two) times daily.  , Disp: , Rfl:  .  calcium carbonate (OS-CAL) 600 MG TABS, Take 600 mg by mouth daily., Disp: , Rfl:  .  Cholecalciferol (VITAMIN D-3 PO), Take by mouth daily.  , Disp: , Rfl:  .  diltiazem (CARDIZEM CD) 120 MG 24 hr capsule, Take 1 capsule (120 mg total) by mouth daily. Further refills to be obtained from PCP, Disp: 90 capsule, Rfl: 0 .  esomeprazole (NEXIUM) 20 MG capsule, Take 40 mg by mouth 2 (two) times daily before a meal. , Disp: , Rfl:  .  hydrochlorothiazide (HYDRODIURIL) 25 MG tablet, Take 25 mg by mouth daily.  , Disp: , Rfl:  .  KLOR-CON 10 10 MEQ tablet, Take 10 mEq by mouth daily. , Disp: , Rfl:  .  losartan (COZAAR) 100 MG tablet, Take 100 mg by mouth daily., Disp: , Rfl:  .  PROAIR HFA 108 (90 BASE) MCG/ACT inhaler, 2 puffs every 4 (four) hours as needed. , Disp: , Rfl: 4 .  Respiratory Therapy Supplies (FLUTTER) DEVI, Use as directed, Disp: 1 each, Rfl: 0 .  simvastatin (ZOCOR) 10 MG tablet, Take 10 mg by mouth at bedtime.  , Disp: , Rfl:  .  traMADol (ULTRAM) 50 MG tablet, One at bedtime if needed, Disp: 90 tablet, Rfl: 0 .  Tiotropium Bromide Monohydrate (SPIRIVA RESPIMAT) 2.5 MCG/ACT AERS, Inhale 2 puffs  into the lungs daily., Disp: 2 Inhaler, Rfl: 0

## 2018-04-28 DIAGNOSIS — H04123 Dry eye syndrome of bilateral lacrimal glands: Secondary | ICD-10-CM | POA: Diagnosis not present

## 2018-05-30 DIAGNOSIS — M25569 Pain in unspecified knee: Secondary | ICD-10-CM | POA: Diagnosis not present

## 2018-05-30 DIAGNOSIS — M810 Age-related osteoporosis without current pathological fracture: Secondary | ICD-10-CM | POA: Diagnosis not present

## 2018-06-09 DIAGNOSIS — M810 Age-related osteoporosis without current pathological fracture: Secondary | ICD-10-CM | POA: Diagnosis not present

## 2018-06-09 DIAGNOSIS — C50411 Malignant neoplasm of upper-outer quadrant of right female breast: Secondary | ICD-10-CM | POA: Diagnosis not present

## 2018-06-09 DIAGNOSIS — J449 Chronic obstructive pulmonary disease, unspecified: Secondary | ICD-10-CM | POA: Diagnosis not present

## 2018-06-09 DIAGNOSIS — I10 Essential (primary) hypertension: Secondary | ICD-10-CM | POA: Diagnosis not present

## 2018-06-27 ENCOUNTER — Ambulatory Visit (HOSPITAL_COMMUNITY)
Admission: RE | Admit: 2018-06-27 | Discharge: 2018-06-27 | Disposition: A | Discharge status: Home / Self Care | Payer: Medicare Other | Source: Ambulatory Visit | Attending: Internal Medicine | Admitting: Internal Medicine

## 2018-06-27 ENCOUNTER — Telehealth: Payer: Self-pay | Admitting: Pulmonary Disease

## 2018-06-27 ENCOUNTER — Encounter (HOSPITAL_COMMUNITY): Payer: Self-pay

## 2018-06-27 DIAGNOSIS — M810 Age-related osteoporosis without current pathological fracture: Secondary | ICD-10-CM | POA: Insufficient documentation

## 2018-06-27 MED ORDER — TRAMADOL HCL 50 MG PO TABS: ORAL_TABLET | ORAL | 0 refills | Status: DC

## 2018-06-27 MED ORDER — ZOLEDRONIC ACID 5 MG/100ML IV SOLN
5.0000 mg | Freq: Once | INTRAVENOUS | Status: AC
  Administered 2018-06-27: 5 mg via INTRAVENOUS
  Filled 2018-06-27: qty 100

## 2018-06-27 MED ORDER — SODIUM CHLORIDE 0.9 % IV SOLN
Freq: Once | INTRAVENOUS | Status: AC
  Administered 2018-06-27: 10:00:00 via INTRAVENOUS

## 2018-06-27 NOTE — Telephone Encounter (Signed)
Received refill request of Tramadol 50mg  tablet.  Med last filled for pt 04/04/18, #90 with 0 RF for pt to take 1 at bedtime prn.  Dr. Lake Bells, please advise if it is okay to refill med for pt. Thanks!

## 2018-06-27 NOTE — Discharge Instructions (Signed)
Zoledronic Acid injection (Paget's Disease, Osteoporosis)/ Reclast °What is this medicine? °ZOLEDRONIC ACID (ZOE le dron ik AS id) lowers the amount of calcium loss from bone. It is used to treat Paget's disease and osteoporosis in women. °This medicine may be used for other purposes; ask your health care provider or pharmacist if you have questions. °COMMON BRAND NAME(S): Reclast, Zometa °What should I tell my health care provider before I take this medicine? °They need to know if you have any of these conditions: °-aspirin-sensitive asthma °-cancer, especially if you are receiving medicines used to treat cancer °-dental disease or wear dentures °-infection °-kidney disease °-low levels of calcium in the blood °-past surgery on the parathyroid gland or intestines °-receiving corticosteroids like dexamethasone or prednisone °-an unusual or allergic reaction to zoledronic acid, other medicines, foods, dyes, or preservatives °-pregnant or trying to get pregnant °-breast-feeding °How should I use this medicine? °This medicine is for infusion into a vein. It is given by a health care professional in a hospital or clinic setting. °Talk to your pediatrician regarding the use of this medicine in children. This medicine is not approved for use in children. °Overdosage: If you think you have taken too much of this medicine contact a poison control center or emergency room at once. °NOTE: This medicine is only for you. Do not share this medicine with others. °What if I miss a dose? °It is important not to miss your dose. Call your doctor or health care professional if you are unable to keep an appointment. °What may interact with this medicine? °-certain antibiotics given by injection °-NSAIDs, medicines for pain and inflammation, like ibuprofen or naproxen °-some diuretics like bumetanide, furosemide °-teriparatide °This list may not describe all possible interactions. Give your health care provider a list of all the  medicines, herbs, non-prescription drugs, or dietary supplements you use. Also tell them if you smoke, drink alcohol, or use illegal drugs. Some items may interact with your medicine. °What should I watch for while using this medicine? °Visit your doctor or health care professional for regular checkups. It may be some time before you see the benefit from this medicine. Do not stop taking your medicine unless your doctor tells you to. Your doctor may order blood tests or other tests to see how you are doing. °Women should inform their doctor if they wish to become pregnant or think they might be pregnant. There is a potential for serious side effects to an unborn child. Talk to your health care professional or pharmacist for more information. °You should make sure that you get enough calcium and vitamin D while you are taking this medicine. Discuss the foods you eat and the vitamins you take with your health care professional. °Some people who take this medicine have severe bone, joint, and/or muscle pain. This medicine may also increase your risk for jaw problems or a broken thigh bone. Tell your doctor right away if you have severe pain in your jaw, bones, joints, or muscles. Tell your doctor if you have any pain that does not go away or that gets worse. °Tell your dentist and dental surgeon that you are taking this medicine. You should not have major dental surgery while on this medicine. See your dentist to have a dental exam and fix any dental problems before starting this medicine. Take good care of your teeth while on this medicine. Make sure you see your dentist for regular follow-up appointments. °What side effects may I notice from receiving this   medicine? °Side effects that you should report to your doctor or health care professional as soon as possible: °-allergic reactions like skin rash, itching or hives, swelling of the face, lips, or tongue °-anxiety, confusion, or depression °-breathing  problems °-changes in vision °-eye pain °-feeling faint or lightheaded, falls °-jaw pain, especially after dental work °-mouth sores °-muscle cramps, stiffness, or weakness °-redness, blistering, peeling or loosening of the skin, including inside the mouth °-trouble passing urine or change in the amount of urine °Side effects that usually do not require medical attention (report to your doctor or health care professional if they continue or are bothersome): °-bone, joint, or muscle pain °-constipation °-diarrhea °-fever °-hair loss °-irritation at site where injected °-loss of appetite °-nausea, vomiting °-stomach upset °-trouble sleeping °-trouble swallowing °-weak or tired °This list may not describe all possible side effects. Call your doctor for medical advice about side effects. You may report side effects to FDA at 1-800-FDA-1088. °Where should I keep my medicine? °This drug is given in a hospital or clinic and will not be stored at home. °NOTE: This sheet is a summary. It may not cover all possible information. If you have questions about this medicine, talk to your doctor, pharmacist, or health care provider. °© 2018 [employer] (2014-04-28 14:19:57) ° °

## 2018-06-27 NOTE — Telephone Encounter (Signed)
Script has been printed and placed in Dr. Anastasia Pall lookat folder.  Will have BQ sign tomorrow, 7/16 at office and then will place it up front in mail for pt.  Nothing further needed.

## 2018-06-27 NOTE — Telephone Encounter (Signed)
OK 

## 2018-06-30 ENCOUNTER — Telehealth: Payer: Self-pay | Admitting: Pulmonary Disease

## 2018-06-30 NOTE — Telephone Encounter (Signed)
Spoke with pt. States that she is following up about her Tramadol prescription. Per previous documentation, this prescription was placed in BQ's sign folder but the message was closed so we could not follow up on the message. I spoke with Burman Nieves and she states that she mailed this prescription out yesterday. I advised pt of this and told her that next month we could call this prescription for her. States that she was told that this couldn't be called in by one of our clinical staff members. Pt will call us back tomorrow if she doesn't receive her prescription. Nothing further was needed.

## 2018-07-12 DIAGNOSIS — M25672 Stiffness of left ankle, not elsewhere classified: Secondary | ICD-10-CM | POA: Diagnosis not present

## 2018-07-12 DIAGNOSIS — M6281 Muscle weakness (generalized): Secondary | ICD-10-CM | POA: Diagnosis not present

## 2018-07-12 DIAGNOSIS — R2681 Unsteadiness on feet: Secondary | ICD-10-CM | POA: Diagnosis not present

## 2018-07-12 DIAGNOSIS — R262 Difficulty in walking, not elsewhere classified: Secondary | ICD-10-CM | POA: Diagnosis not present

## 2018-07-13 DIAGNOSIS — M25672 Stiffness of left ankle, not elsewhere classified: Secondary | ICD-10-CM | POA: Diagnosis not present

## 2018-07-13 DIAGNOSIS — M6281 Muscle weakness (generalized): Secondary | ICD-10-CM | POA: Diagnosis not present

## 2018-07-13 DIAGNOSIS — R262 Difficulty in walking, not elsewhere classified: Secondary | ICD-10-CM | POA: Diagnosis not present

## 2018-07-13 DIAGNOSIS — R2681 Unsteadiness on feet: Secondary | ICD-10-CM | POA: Diagnosis not present

## 2018-07-14 DIAGNOSIS — R262 Difficulty in walking, not elsewhere classified: Secondary | ICD-10-CM | POA: Diagnosis not present

## 2018-07-14 DIAGNOSIS — M25672 Stiffness of left ankle, not elsewhere classified: Secondary | ICD-10-CM | POA: Diagnosis not present

## 2018-07-14 DIAGNOSIS — M6281 Muscle weakness (generalized): Secondary | ICD-10-CM | POA: Diagnosis not present

## 2018-07-14 DIAGNOSIS — R2681 Unsteadiness on feet: Secondary | ICD-10-CM | POA: Diagnosis not present

## 2018-07-19 DIAGNOSIS — R262 Difficulty in walking, not elsewhere classified: Secondary | ICD-10-CM | POA: Diagnosis not present

## 2018-07-19 DIAGNOSIS — M25672 Stiffness of left ankle, not elsewhere classified: Secondary | ICD-10-CM | POA: Diagnosis not present

## 2018-07-19 DIAGNOSIS — R2681 Unsteadiness on feet: Secondary | ICD-10-CM | POA: Diagnosis not present

## 2018-07-19 DIAGNOSIS — M6281 Muscle weakness (generalized): Secondary | ICD-10-CM | POA: Diagnosis not present

## 2018-07-20 DIAGNOSIS — R2681 Unsteadiness on feet: Secondary | ICD-10-CM | POA: Diagnosis not present

## 2018-07-20 DIAGNOSIS — M6281 Muscle weakness (generalized): Secondary | ICD-10-CM | POA: Diagnosis not present

## 2018-07-20 DIAGNOSIS — M25672 Stiffness of left ankle, not elsewhere classified: Secondary | ICD-10-CM | POA: Diagnosis not present

## 2018-07-20 DIAGNOSIS — R262 Difficulty in walking, not elsewhere classified: Secondary | ICD-10-CM | POA: Diagnosis not present

## 2018-07-21 DIAGNOSIS — R2681 Unsteadiness on feet: Secondary | ICD-10-CM | POA: Diagnosis not present

## 2018-07-21 DIAGNOSIS — R262 Difficulty in walking, not elsewhere classified: Secondary | ICD-10-CM | POA: Diagnosis not present

## 2018-07-21 DIAGNOSIS — M6281 Muscle weakness (generalized): Secondary | ICD-10-CM | POA: Diagnosis not present

## 2018-07-21 DIAGNOSIS — M25672 Stiffness of left ankle, not elsewhere classified: Secondary | ICD-10-CM | POA: Diagnosis not present

## 2018-07-25 DIAGNOSIS — M25672 Stiffness of left ankle, not elsewhere classified: Secondary | ICD-10-CM | POA: Diagnosis not present

## 2018-07-25 DIAGNOSIS — M6281 Muscle weakness (generalized): Secondary | ICD-10-CM | POA: Diagnosis not present

## 2018-07-25 DIAGNOSIS — R262 Difficulty in walking, not elsewhere classified: Secondary | ICD-10-CM | POA: Diagnosis not present

## 2018-07-25 DIAGNOSIS — R2681 Unsteadiness on feet: Secondary | ICD-10-CM | POA: Diagnosis not present

## 2018-07-27 DIAGNOSIS — M6281 Muscle weakness (generalized): Secondary | ICD-10-CM | POA: Diagnosis not present

## 2018-07-27 DIAGNOSIS — R2681 Unsteadiness on feet: Secondary | ICD-10-CM | POA: Diagnosis not present

## 2018-07-27 DIAGNOSIS — M25672 Stiffness of left ankle, not elsewhere classified: Secondary | ICD-10-CM | POA: Diagnosis not present

## 2018-07-27 DIAGNOSIS — R262 Difficulty in walking, not elsewhere classified: Secondary | ICD-10-CM | POA: Diagnosis not present

## 2018-07-29 DIAGNOSIS — M6281 Muscle weakness (generalized): Secondary | ICD-10-CM | POA: Diagnosis not present

## 2018-07-29 DIAGNOSIS — R262 Difficulty in walking, not elsewhere classified: Secondary | ICD-10-CM | POA: Diagnosis not present

## 2018-07-29 DIAGNOSIS — R2681 Unsteadiness on feet: Secondary | ICD-10-CM | POA: Diagnosis not present

## 2018-07-29 DIAGNOSIS — M25672 Stiffness of left ankle, not elsewhere classified: Secondary | ICD-10-CM | POA: Diagnosis not present

## 2018-08-01 DIAGNOSIS — R2681 Unsteadiness on feet: Secondary | ICD-10-CM | POA: Diagnosis not present

## 2018-08-01 DIAGNOSIS — M25672 Stiffness of left ankle, not elsewhere classified: Secondary | ICD-10-CM | POA: Diagnosis not present

## 2018-08-01 DIAGNOSIS — R262 Difficulty in walking, not elsewhere classified: Secondary | ICD-10-CM | POA: Diagnosis not present

## 2018-08-01 DIAGNOSIS — M6281 Muscle weakness (generalized): Secondary | ICD-10-CM | POA: Diagnosis not present

## 2018-08-02 ENCOUNTER — Emergency Department (HOSPITAL_COMMUNITY)
Admission: EM | Admit: 2018-08-02 | Discharge: 2018-08-02 | Disposition: A | Discharge status: Home / Self Care | Payer: Medicare Other | Attending: Emergency Medicine | Admitting: Emergency Medicine

## 2018-08-02 ENCOUNTER — Emergency Department (HOSPITAL_COMMUNITY): Payer: Medicare Other

## 2018-08-02 ENCOUNTER — Encounter (HOSPITAL_COMMUNITY): Payer: Self-pay

## 2018-08-02 ENCOUNTER — Other Ambulatory Visit: Payer: Self-pay

## 2018-08-02 DIAGNOSIS — W1839XA Other fall on same level, initial encounter: Secondary | ICD-10-CM | POA: Insufficient documentation

## 2018-08-02 DIAGNOSIS — Y939 Activity, unspecified: Secondary | ICD-10-CM | POA: Insufficient documentation

## 2018-08-02 DIAGNOSIS — R51 Headache: Secondary | ICD-10-CM | POA: Diagnosis not present

## 2018-08-02 DIAGNOSIS — Z79899 Other long term (current) drug therapy: Secondary | ICD-10-CM | POA: Diagnosis not present

## 2018-08-02 DIAGNOSIS — Z87891 Personal history of nicotine dependence: Secondary | ICD-10-CM | POA: Insufficient documentation

## 2018-08-02 DIAGNOSIS — S0101XA Laceration without foreign body of scalp, initial encounter: Secondary | ICD-10-CM

## 2018-08-02 DIAGNOSIS — S199XXA Unspecified injury of neck, initial encounter: Secondary | ICD-10-CM | POA: Diagnosis not present

## 2018-08-02 DIAGNOSIS — I10 Essential (primary) hypertension: Secondary | ICD-10-CM | POA: Insufficient documentation

## 2018-08-02 DIAGNOSIS — Z853 Personal history of malignant neoplasm of breast: Secondary | ICD-10-CM | POA: Insufficient documentation

## 2018-08-02 DIAGNOSIS — W19XXXA Unspecified fall, initial encounter: Secondary | ICD-10-CM

## 2018-08-02 DIAGNOSIS — Y999 Unspecified external cause status: Secondary | ICD-10-CM | POA: Insufficient documentation

## 2018-08-02 DIAGNOSIS — J449 Chronic obstructive pulmonary disease, unspecified: Secondary | ICD-10-CM | POA: Insufficient documentation

## 2018-08-02 DIAGNOSIS — S0990XA Unspecified injury of head, initial encounter: Secondary | ICD-10-CM

## 2018-08-02 DIAGNOSIS — Y929 Unspecified place or not applicable: Secondary | ICD-10-CM | POA: Diagnosis not present

## 2018-08-02 DIAGNOSIS — M542 Cervicalgia: Secondary | ICD-10-CM | POA: Diagnosis not present

## 2018-08-02 DIAGNOSIS — Z7982 Long term (current) use of aspirin: Secondary | ICD-10-CM | POA: Insufficient documentation

## 2018-08-02 MED ORDER — LIDOCAINE HCL (PF) 2 % IJ SOLN
INTRAMUSCULAR | Status: AC
  Filled 2018-08-02: qty 10

## 2018-08-02 NOTE — Discharge Instructions (Addendum)
Keep the wound clean with mild soap and water.  Staples out in 7 days.  Follow-up with your primary care provider for recheck.  Return to the ER for any worsening symptoms such as severe, worsening headache, vomiting, visual changes, or persistent bleeding from the wound.

## 2018-08-02 NOTE — ED Provider Notes (Signed)
[facility] EMERGENCY DEPARTMENT Provider Note   CSN: [id number] Arrival date & time: 08/02/18  5852     History   Chief Complaint Chief Complaint  Patient presents with  . Fall    HPI Elizabeth Sparks is a 77 y.o. female.  HPI    Elizabeth Sparks is a 77 y.o. female with hx of HTN, COPD, PAT and who presents to the Emergency Department complaining of a mechanical fall that occurred around 6:30 am this morning.  States that she has "stiffness" of her left leg secondary to arthritis and her leg felt weak this morning causing her to fall.  She states that she fell and struck her left head on a nightstand and has a small puncture to the left scalp.  Bleeding controlled with direct pressure.  She also complains of mild pain to her left neck and a dull left sided headache. She ambulates with a walker at baseline.  She denies LOC, dizziness, N/V, visual changes, chest, abdominal or low back pain.  She took tylenol prior to arrival.  Last Td was 2017.  Currently taking PT for her leg weakness.      Past Medical History:  Diagnosis Date  . Arthritis   . Atrial fibrillation ([place]) 07/17/14  . Breast cancer ([place])   . Cancer ([place])    right breast  . Complication of anesthesia    has had ilius  . COPD (chronic obstructive pulmonary disease) ([place])   . GERD (gastroesophageal reflux disease)   . History of ongoing treatment with alendronate (Fosamax)   . Hypertension   . S/P radiation therapy comp. 02/10/12   42.5 Gy / 16 Fractions to Right Breast at [facility]  . Shortness of breath   . Thyroid disease   . Wheezing     Patient Active Problem List   Diagnosis Date Noted  . Acute bronchitis with bronchiectasis ([place]) 11/29/2017  . GERD (gastroesophageal reflux disease) 08/13/2016  . Thrush 07/24/2015  . PAT (paroxysmal atrial tachycardia) ([place]) 07/17/2014  . COPD GOLD II/ AB/bronchiectasis  05/28/2014  . Cancer of central portion of female breast ([place]) 04/21/2012    . 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 HYPERLIPIDEMIA 12/25/2010  . HYPERTENSION 12/25/2010  . Allergic rhinitis 12/25/2010  . Bronchiectasis without acute exacerbation ([place]) 12/25/2010  . Chronic cough 12/25/2010    Past Surgical History:  Procedure Laterality Date  . ABDOMINAL HYSTERECTOMY  1974   part hyst  . BREAST LUMPECTOMY Right 2012   radiation  . BREAST SURGERY  11/20/2011   Rt partial mastectomy  . HERNIA REPAIR  2011   abd wall hernia  . pelvic tumors  2009  . ROTATOR CUFF REPAIR  2001   left  . UTERINE FIBROID SURGERY  2009   ovaries out     OB History   None      Home Medications    Prior to Admission medications   Medication Sig Start Date End Date Taking? Authorizing Provider  acetaminophen (TYLENOL) 325 MG tablet Take 325 mg by mouth every 6 (six) hours as needed.    [provider]  aspirin 81 MG tablet Take 81 mg by mouth daily.     [provider]  budesonide-formoterol (SYMBICORT) 160-4.5 MCG/ACT inhaler Inhale 2 puffs into the lungs 2 (two) times daily.      [provider]  calcium carbonate (OS-CAL) 600 MG TABS Take 600 mg by mouth daily.    [provider]  Cholecalciferol (VITAMIN D-3 PO) Take by mouth daily.      [provider]  Cranberry 500 MG CAPS Take 2 capsules by mouth daily.    [provider]  diltiazem (CARDIZEM CD) 120 MG 24 hr capsule Take 1 capsule (120 mg total) by mouth daily. Further refills to be obtained from PCP 10/23/16   Jerline Pain, MD  esomeprazole (NEXIUM) 20 MG capsule Take 40 mg by mouth 2 (two) times daily before a meal.     [provider]  hydrochlorothiazide (HYDRODIURIL) 25 MG tablet Take 25 mg by mouth daily.      [provider]  KLOR-CON 10 10 MEQ tablet Take 10 mEq by mouth daily.  03/12/14   [provider]  losartan (COZAAR) 100 MG tablet Take 100 mg by mouth daily.    [provider]  PROAIR HFA  108 (90 BASE) MCG/ACT inhaler 2 puffs every 4 (four) hours as needed.  06/04/15   [provider]  Respiratory Therapy Supplies (FLUTTER) DEVI Use as directed 02/18/17   Juanito Doom, MD  simvastatin (ZOCOR) 10 MG tablet Take 10 mg by mouth at bedtime.      [provider]  Tiotropium Bromide Monohydrate (SPIRIVA RESPIMAT) 2.5 MCG/ACT AERS Inhale 2 puffs into the lungs daily. 08/13/16 06/27/18  Juanito Doom, MD  traMADol Veatrice Bourbon) 50 MG tablet One at bedtime if needed 06/27/18   Juanito Doom, MD    Family History Family History  Problem Relation Age of Onset  . Heart disease Mother   . Cancer Father        prostate  . Heart disease Father   . Cancer Maternal Grandmother        liver    Social History Social History   Tobacco Use  . Smoking status: Former Smoker    Packs/day: 0.50    Years: 45.00    Pack years: 22.50    Types: Cigarettes    Last attempt to quit: 10/28/2009    Years since quitting: 8.7  . Smokeless tobacco: Never Used  Substance Use Topics  . Alcohol use: Yes    Alcohol/week: 2.0 standard drinks    Types: 2 Glasses of wine per week  . Drug use: No     Allergies   Codeine; Evista [raloxifene]; and Procaine hcl   Review of Systems Review of Systems  Constitutional: Negative for fever.  HENT: Negative for trouble swallowing.        Scalp laceration  Eyes: Negative for visual disturbance.  Respiratory: Negative for chest tightness and shortness of breath.   Cardiovascular: Negative for chest pain.  Gastrointestinal: Negative for abdominal pain, nausea and vomiting.  Genitourinary: Negative for flank pain.  Musculoskeletal: Positive for neck pain. Negative for back pain.  Neurological: Positive for headaches. Negative for dizziness, syncope, facial asymmetry and numbness.     Physical Exam Updated Vital Signs BP (!) 162/64 (BP Location: Left Arm)   Pulse 73   Temp 97.9 F (36.6 C) (Oral)   Resp 18   Ht 5\' 3"  (1.6  m)   Wt 56.2 kg   SpO2 100%   BMI 21.97 kg/m   Physical Exam  Constitutional: She is oriented to person, place, and time. She appears well-nourished. No distress.  HENT:  Mouth/Throat: Oropharynx is clear and moist.  1 cm laceration to the left parietal scalp.  Small hematoma present.  No active bleeding.  Eyes: Pupils are equal, round, and reactive to light.  Conjunctivae and EOM are normal.  Neck: Normal range of motion and phonation normal. Muscular tenderness present. No spinous process tenderness present. Normal range of motion present.  Mild tenderness of the left cervical paraspinal muscles.  No bony tenderness or step-offs.  Cardiovascular: Normal rate, regular rhythm and intact distal pulses.  Pulmonary/Chest: Effort normal and breath sounds normal. No respiratory distress. She exhibits no tenderness.  Abdominal: Soft. She exhibits no distension. There is no tenderness. There is no guarding.  Musculoskeletal: Normal range of motion.  No tenderness to palpation of the lumbar spine.  No ecchymosis or step-off deformities.  Negative straight leg raise bilaterally.  No tenderness of the bilateral hips. Pt has full ROM of the left arm, no bony tenderness  Neurological: She is alert and oriented to person, place, and time. No sensory deficit. GCS eye subscore is 4. GCS verbal subscore is 5. GCS motor subscore is 6.  CN II-XII grossly intact.  Speech clear, no pronator drift,  Skin: Skin is warm. Capillary refill takes less than 2 seconds.  Several ecchymosis of the left upper and lower arm.  No hematomas or open wounds.   Psychiatric: She has a normal mood and affect.  Nursing note and vitals reviewed.    ED Treatments / Results  Labs (all labs ordered are listed, but only abnormal results are displayed) Labs Reviewed - No data to display  EKG None  Radiology Ct Head Wo Contrast  Result Date: 08/02/2018 CLINICAL DATA:  77 year old female with history of trauma from a fall out  of bed with injury to the head on a night stand. Head and neck pain. EXAM: CT HEAD WITHOUT CONTRAST CT CERVICAL SPINE WITHOUT CONTRAST TECHNIQUE: Multidetector CT imaging of the head and cervical spine was performed following the standard protocol without intravenous contrast. Multiplanar CT image reconstructions of the cervical spine were also generated. COMPARISON:  Head CT 06/10/2014. FINDINGS: CT HEAD FINDINGS Brain: Mild cerebral atrophy. Patchy and confluent areas of decreased attenuation are noted throughout the deep and periventricular white matter of the cerebral hemispheres bilaterally, compatible with chronic microvascular ischemic disease. In the left temporal region there is a small extra-axial lesion measuring 1.2 x 0.8 cm which is calcified, most compatible with a small meningioma. No evidence of acute infarction, hemorrhage, hydrocephalus, extra-axial collection or other mass lesion/mass effect. Vascular: No hyperdense vessel or unexpected calcification. Skull: Normal. Negative for fracture or focal lesion. Sinuses/Orbits: No acute finding. Other: Focal soft tissue contusion and probable scalp laceration in the left parietal region. CT CERVICAL SPINE FINDINGS Alignment: Normal. Skull base and vertebrae: No acute fracture. No primary bone lesion or focal pathologic process. Soft tissues and spinal canal: No prevertebral fluid or swelling. No visible canal hematoma. Disc levels: Multilevel degenerative disc disease, most severe at C5-C6. Mild multilevel facet arthropathy. Upper chest: Areas of cylindrical bronchiectasis and peripheral bronchiolectasis noted in the lung apices bilaterally. Other: None. IMPRESSION: 1. Small focal scalp contusion and probable laceration in the left parietal scalp. No underlying displaced skull fractures or evidence of acute intracranial trauma. 2. No evidence of acute traumatic injury to the cervical spine. 3. Small calcified meningioma in the left temporal region. 4.  Mild cerebral atrophy with chronic microvascular ischemic changes in the cerebral white matter, as above. Electronically Signed   By: Vinnie Langton M.D.   On: 08/02/2018 10:02   Ct Cervical Spine Wo Contrast  Result Date: 08/02/2018 CLINICAL DATA:  77 year old female with history of trauma from a fall out of  bed with injury to the head on a night stand. Head and neck pain. EXAM: CT HEAD WITHOUT CONTRAST CT CERVICAL SPINE WITHOUT CONTRAST TECHNIQUE: Multidetector CT imaging of the head and cervical spine was performed following the standard protocol without intravenous contrast. Multiplanar CT image reconstructions of the cervical spine were also generated. COMPARISON:  Head CT 06/10/2014. FINDINGS: CT HEAD FINDINGS Brain: Mild cerebral atrophy. Patchy and confluent areas of decreased attenuation are noted throughout the deep and periventricular white matter of the cerebral hemispheres bilaterally, compatible with chronic microvascular ischemic disease. In the left temporal region there is a small extra-axial lesion measuring 1.2 x 0.8 cm which is calcified, most compatible with a small meningioma. No evidence of acute infarction, hemorrhage, hydrocephalus, extra-axial collection or other mass lesion/mass effect. Vascular: No hyperdense vessel or unexpected calcification. Skull: Normal. Negative for fracture or focal lesion. Sinuses/Orbits: No acute finding. Other: Focal soft tissue contusion and probable scalp laceration in the left parietal region. CT CERVICAL SPINE FINDINGS Alignment: Normal. Skull base and vertebrae: No acute fracture. No primary bone lesion or focal pathologic process. Soft tissues and spinal canal: No prevertebral fluid or swelling. No visible canal hematoma. Disc levels: Multilevel degenerative disc disease, most severe at C5-C6. Mild multilevel facet arthropathy. Upper chest: Areas of cylindrical bronchiectasis and peripheral bronchiolectasis noted in the lung apices bilaterally.  Other: None. IMPRESSION: 1. Small focal scalp contusion and probable laceration in the left parietal scalp. No underlying displaced skull fractures or evidence of acute intracranial trauma. 2. No evidence of acute traumatic injury to the cervical spine. 3. Small calcified meningioma in the left temporal region. 4. Mild cerebral atrophy with chronic microvascular ischemic changes in the cerebral white matter, as above. Electronically Signed   By: Vinnie Langton M.D.   On: 08/02/2018 10:02    Procedures Procedures (including critical care time)  LACERATION REPAIR Performed by: Jesusita Jocelyn Authorized by: Cross Jorge Consent: Verbal consent obtained. Risks and benefits: risks, benefits and alternatives were discussed Consent given by: patient Patient identity confirmed: provided demographic data Prepped and Draped in normal sterile fashion Wound explored  Laceration Location: Left parietal scalp  Laceration Length: 1 cm  No Foreign Bodies seen or palpated  Anesthesia: local infiltration  Local anesthetic: lidocaine 2 % out epinephrine  Anesthetic total: 2 ml  Irrigation method: syringe Amount of cleaning: standard  Skin closure: Staples  Number of sutures: 2  Technique: stapling  Patient tolerance: Patient tolerated the procedure well with no immediate complications.   Medications Ordered in ED Medications - No data to display   Initial Impression / Assessment and Plan / ED Course  I have reviewed the triage vital signs and the nursing notes.  Pertinent labs & imaging results that were available during my care of the patient were reviewed by me and considered in my medical decision making (see chart for details).     CT scan of head and C spine neg for fx or evidence of acute intracranial injury.  Pt is comfortable appearing.  Td is up to date.  NV intact.  Ambulates with a walker at baseline.  Laceration scalp closed with 2 staples, bleeding controlled prior to  closure.   Wound care and head injury instructions discussed, staples out in 7 days.  Return precautions discussed.   Final Clinical Impressions(s) / ED Diagnoses   Final diagnoses:  Fall, initial encounter  Minor head injury, initial encounter  Laceration of scalp, initial encounter    ED Discharge Orders  None       Kem Parkinson, PA-C 08/02/18 1041    Maudie Flakes, MD 08/02/18 1105

## 2018-08-02 NOTE — ED Triage Notes (Signed)
PT reports she fell this morning and struck head on her night stand.  Bleeding controlled.  No loss of consciousness.  Pt reports she has weakness in her left leg due to osteoarthritis and has physical therapy.

## 2018-08-08 DIAGNOSIS — M6281 Muscle weakness (generalized): Secondary | ICD-10-CM | POA: Diagnosis not present

## 2018-08-08 DIAGNOSIS — R2681 Unsteadiness on feet: Secondary | ICD-10-CM | POA: Diagnosis not present

## 2018-08-08 DIAGNOSIS — M25672 Stiffness of left ankle, not elsewhere classified: Secondary | ICD-10-CM | POA: Diagnosis not present

## 2018-08-08 DIAGNOSIS — R262 Difficulty in walking, not elsewhere classified: Secondary | ICD-10-CM | POA: Diagnosis not present

## 2018-08-09 ENCOUNTER — Other Ambulatory Visit: Payer: Self-pay | Admitting: Internal Medicine

## 2018-08-09 DIAGNOSIS — M21372 Foot drop, left foot: Secondary | ICD-10-CM

## 2018-08-10 ENCOUNTER — Ambulatory Visit
Admission: RE | Admit: 2018-08-10 | Discharge: 2018-08-10 | Disposition: A | Discharge status: Home / Self Care | Payer: Medicare Other | Source: Ambulatory Visit | Attending: Internal Medicine | Admitting: Internal Medicine

## 2018-08-10 DIAGNOSIS — G629 Polyneuropathy, unspecified: Secondary | ICD-10-CM | POA: Diagnosis not present

## 2018-08-10 DIAGNOSIS — M48061 Spinal stenosis, lumbar region without neurogenic claudication: Secondary | ICD-10-CM | POA: Diagnosis not present

## 2018-08-10 DIAGNOSIS — M21372 Foot drop, left foot: Secondary | ICD-10-CM

## 2018-08-24 DIAGNOSIS — F329 Major depressive disorder, single episode, unspecified: Secondary | ICD-10-CM | POA: Diagnosis not present

## 2018-08-24 DIAGNOSIS — M21372 Foot drop, left foot: Secondary | ICD-10-CM | POA: Diagnosis not present

## 2018-08-25 ENCOUNTER — Encounter: Payer: Self-pay | Admitting: Neurology

## 2018-09-16 DIAGNOSIS — M21372 Foot drop, left foot: Secondary | ICD-10-CM | POA: Diagnosis not present

## 2018-09-17 DIAGNOSIS — Z23 Encounter for immunization: Secondary | ICD-10-CM | POA: Diagnosis not present

## 2018-09-27 ENCOUNTER — Telehealth: Payer: Self-pay | Admitting: Pulmonary Disease

## 2018-09-27 NOTE — Telephone Encounter (Signed)
  Pt requesting refill on Tramadol 50 mg.  Last OV: 04/26/2018 Last Rx: 06/27/2018   Patient Instructions by Juanito Doom, MD at 04/26/2018 9:45 AM  Author: Juanito Doom, MD Author Type: Physician Filed: 04/26/2018 10:12 AM  Note Status: Signed Cosign: Cosign Not Required Encounter Date: 04/26/2018  Editor: Juanito Doom, MD (Physician)    Osteoporosis: There are alternatives to Fosamax, I will try to send a note to primary care physician to consider these  Bronchiectasis: Continue Symbicort 2 puffs twice a day Continue using the flutter valve 10 breaths twice a day Use albuterol as needed for chest tightness wheezing or shortness of breath Call us if you have an increase in mucus production, chest tightness wheezing or shortness of breath  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

## 2018-09-28 MED ORDER — TRAMADOL HCL 50 MG PO TABS: ORAL_TABLET | ORAL | 0 refills | Status: DC

## 2018-09-28 NOTE — Telephone Encounter (Signed)
Called and spoke with pt letting her know BQ said it was fine for her to have a refill of the Tramadol. Pt expressed understanding.  I verified the pharmacy that the med needed to be called in to for pt.  Called [facility] and spoke with Sam and gave verbal information for pt's Rx. Sam stated she would get the Rx ready for pt.  Nothing further needed.

## 2018-09-28 NOTE — Telephone Encounter (Signed)
OK to refill as last Rx

## 2018-10-03 ENCOUNTER — Telehealth: Payer: Self-pay | Admitting: Pulmonary Disease

## 2018-10-03 NOTE — Telephone Encounter (Signed)
Called and spoke to pt, who states that Rx was Tramdol 50mg  was not received by [facility].  Per our records, Rx was phoned in to Sam at [facility].  I have spoken to [place] with [facility], who confirmed that Rx was received and will processed.  Pt is aware and voiced her understanding. Nothing further is needed.

## 2018-10-20 ENCOUNTER — Encounter: Payer: Self-pay | Admitting: Neurology

## 2018-10-20 ENCOUNTER — Ambulatory Visit: Payer: Medicare Other | Admitting: Neurology

## 2018-10-20 VITALS — BP 114/62 | HR 96 | Ht 63.25 in | Wt 123.0 lb

## 2018-10-20 DIAGNOSIS — G5732 Lesion of lateral popliteal nerve, left lower limb: Secondary | ICD-10-CM

## 2018-10-20 NOTE — Patient Instructions (Signed)
Unfortunately, there really isn't much else to treat foot drop if physical therapy failed. I will obtain copy of the nerve conduction study and let you know if I have other recommendations

## 2018-10-20 NOTE — Progress Notes (Signed)
NEUROLOGY CONSULTATION NOTE  Elizabeth Sparks MRN: [id number] DOB: 14-Apr-1941  Referring provider: Seward Carol, MD Primary care provider: Seward Carol, MD  Reason for consult:  left foot drop  HISTORY OF PRESENT ILLNESS: Elizabeth Sparks is a 77 year old right-handed female with atrial fibrillation, coronary artery disease, hypercholesterolemia, COPD, hypertension, thyroid disease, and history of right breast cancer who presents for evaluation of left foot drop.  History supplemented by referring providers note.  She is accompanied by her daughter who supplements history.  In late May, she developed left foot drop.  She initially walked with a cane and then started having falls.  So she started using a walker.  She notes her hips bother her.  She has associated burning in the left foot.  MRI of lumbar spine from 08/10/18 was personally reviewed and demonstrated advanced multilevel lumbar disc and facet disease resulting in moderate spinal stenosis at L3-4, mild spinal stenosis at L2-3 and L4-5, moderate left neural foraminal stenosis at L3-4 and severe L5-S1 facet arthrosis with mild lateral recess stenosis.  She had a an NCV/EMG of the left lower extremity which reportedly showed a peroneal neuropathy.  She did physical therapy for 5 weeks which was ineffective. She is currently using a orthotic.  She denies crossing her legs.  She does not have history of diabetes.  She has no prior history of foot drop or other compressive neuropathies.  At baseline, she has swelling in the legs.    PAST MEDICAL HISTORY: Past Medical History:  Diagnosis Date  . Arthritis   . Atrial fibrillation ([place]) 07/17/14  . Breast cancer ([place])   . Cancer ([place])    right breast  . Complication of anesthesia    has had ilius  . COPD (chronic obstructive pulmonary disease) ([place])   . GERD (gastroesophageal reflux disease)   . History of ongoing treatment with alendronate (Fosamax)   . Hypertension   . S/P  radiation therapy comp. 02/10/12   42.5 Gy / 16 Fractions to Right Breast at [facility]  . Shortness of breath   . Thyroid disease   . Wheezing     PAST SURGICAL HISTORY: Past Surgical History:  Procedure Laterality Date  . ABDOMINAL HYSTERECTOMY  1974   part hyst  . BREAST LUMPECTOMY Right 2012   radiation  . BREAST SURGERY  11/20/2011   Rt partial mastectomy  . HERNIA REPAIR  2011   abd wall hernia  . pelvic tumors  2009  . ROTATOR CUFF REPAIR  2001   left  . UTERINE FIBROID SURGERY  2009   ovaries out    MEDICATIONS: Current Outpatient Medications on File Prior to Visit  Medication Sig Dispense Refill  . acetaminophen (TYLENOL) 325 MG tablet Take 325 mg by mouth every 6 (six) hours as needed.    Marland Kitchen aspirin 81 MG tablet Take 81 mg by mouth daily.     . budesonide-formoterol (SYMBICORT) 160-4.5 MCG/ACT inhaler Inhale 2 puffs into the lungs 2 (two) times daily.      . calcium carbonate (OS-CAL) 600 MG TABS Take 600 mg by mouth daily.    . Cholecalciferol (VITAMIN D-3 PO) Take by mouth daily.      . Cranberry 500 MG CAPS Take 2 capsules by mouth daily.    Marland Kitchen diltiazem (CARDIZEM CD) 120 MG 24 hr capsule Take 1 capsule (120 mg total) by mouth daily. Further refills to be obtained from PCP 90 capsule 0  . esomeprazole (NEXIUM) 20 MG  capsule Take 40 mg by mouth 2 (two) times daily before a meal.     . hydrochlorothiazide (HYDRODIURIL) 25 MG tablet Take 25 mg by mouth daily.      Marland Kitchen KLOR-CON 10 10 MEQ tablet Take 10 mEq by mouth daily.     Marland Kitchen losartan (COZAAR) 100 MG tablet Take 100 mg by mouth daily.    Marland Kitchen PROAIR HFA 108 (90 BASE) MCG/ACT inhaler 2 puffs every 4 (four) hours as needed.   4  . Respiratory Therapy Supplies (FLUTTER) DEVI Use as directed 1 each 0  . simvastatin (ZOCOR) 10 MG tablet Take 10 mg by mouth at bedtime.      . Tiotropium Bromide Monohydrate (SPIRIVA RESPIMAT) 2.5 MCG/ACT AERS Inhale 2 puffs into the lungs daily. 2 Inhaler 0  . traMADol (ULTRAM)  50 MG tablet One at bedtime if needed 90 tablet 0   No current facility-administered medications on file prior to visit.     ALLERGIES: Allergies  Allergen Reactions  . Codeine     REACTION: headache and nausea  . Evista [Raloxifene]   . Procaine Hcl     REACTION: headaches    FAMILY HISTORY: Family History  Problem Relation Age of Onset  . Heart disease Mother   . Cancer Father        prostate  . Heart disease Father   . Cancer Maternal Grandmother        liver   SOCIAL HISTORY: Social History   Socioeconomic History  . Marital status: Married    Spouse name: Not on file  . Number of children: Not on file  . Years of education: Not on file  . Highest education level: Not on file  Occupational History  . Occupation: retired  Scientific laboratory technician  . Financial resource strain: Not on file  . Food insecurity:    Worry: Not on file    Inability: Not on file  . Transportation needs:    Medical: Not on file    Non-medical: Not on file  Tobacco Use  . Smoking status: Former Smoker    Packs/day: 0.50    Years: 45.00    Pack years: 22.50    Types: Cigarettes    Last attempt to quit: 10/28/2009    Years since quitting: 8.9  . Smokeless tobacco: Never Used  Substance and Sexual Activity  . Alcohol use: Yes    Alcohol/week: 2.0 standard drinks    Types: 2 Glasses of wine per week  . Drug use: No  . Sexual activity: Yes    Birth control/protection: Post-menopausal  Lifestyle  . Physical activity:    Days per week: Not on file    Minutes per session: Not on file  . Stress: Not on file  Relationships  . Social connections:    Talks on phone: Not on file    Gets together: Not on file    Attends religious service: Not on file    Active member of club or organization: Not on file    Attends meetings of clubs or organizations: Not on file    Relationship status: Not on file  . Intimate partner violence:    Fear of current or ex partner: Not on file    Emotionally  abused: Not on file    Physically abused: Not on file    Forced sexual activity: Not on file  Other Topics Concern  . Not on file  Social History Narrative  . Not on file  REVIEW OF SYSTEMS: Constitutional: No fevers, chills, or sweats, no generalized fatigue, change in appetite Eyes: No visual changes, double vision, eye pain Ear, nose and throat: No hearing loss, ear pain, nasal congestion, sore throat Cardiovascular: No chest pain, palpitations Respiratory:  No shortness of breath at rest or with exertion, wheezes GastrointestinaI: No nausea, vomiting, diarrhea, abdominal pain, fecal incontinence Genitourinary:  No dysuria, urinary retention or frequency Musculoskeletal:  No neck pain, back pain Integumentary: No rash, pruritus, skin lesions Neurological: as above Psychiatric: No depression, insomnia, anxiety Endocrine: No palpitations, fatigue, diaphoresis, mood swings, change in appetite, change in weight, increased thirst Hematologic/Lymphatic:  No purpura, petechiae. Allergic/Immunologic: no itchy/runny eyes, nasal congestion, recent allergic reactions, rashes  PHYSICAL EXAM: Blood pressure 114/62, pulse 96, height 5' 3.25" (1.607 m), weight 123 lb (55.8 kg), SpO2 95 %. General: No acute distress.  Patient appears well-groomed.  Head:  Normocephalic/atraumatic Eyes:  fundi examined but not visualized Neck: supple, no paraspinal tenderness, full range of motion Back: No paraspinal tenderness Heart: regular rate and rhythm Lungs: Clear to auscultation bilaterally. Vascular: No carotid bruits. Neurological Exam: Mental status: alert and oriented to person, place, and time, recent and remote memory intact, fund of knowledge intact, attention and concentration intact, speech fluent and not dysarthric, language intact. Cranial nerves: CN I: not tested CN II: pupils equal, round and reactive to light, visual fields intact CN III, IV, VI:  full range of motion, no nystagmus,  no ptosis CN V: facial sensation intact CN VII: upper and lower face symmetric CN VIII: hearing intact CN IX, X: gag intact, uvula midline CN XI: sternocleidomastoid and trapezius muscles intact CN XII: tongue midline Bulk & Tone: normal, no fasciculations. Motor:  3+/5 left ankle dorsiflexion.  Otherwise, 5/5 throughout Sensation:  Pinprick sensation reduced over the dorsum of the left foot and anterior leg below the knee.  Vibration sensation reduced in the toes of the left foot.. Deep Tendon Reflexes:  2+ throughout, toes downgoing.  Finger to nose testing:  Without dysmetria.  Gait:  Needs assistance to ambulate.  Steppage gait on left.  Romberg testing deferred.  IMPRESSION: Left peroneal neuropathy.  Unfortunately, there is not much else to be done if physical therapy fails.  PLAN: 1.  I would like to obtain the results of her nerve conduction study.  Further recommendations pending my review. 2.  Otherwise, I recommend to continue using the orthotic and performing home exercises  Thank you for allowing me to take part in the care of this patient.  Metta Clines, DO  CC: Seward Carol, MD

## 2018-10-27 DIAGNOSIS — E7800 Pure hypercholesterolemia, unspecified: Secondary | ICD-10-CM | POA: Diagnosis not present

## 2018-10-27 DIAGNOSIS — I2510 Atherosclerotic heart disease of native coronary artery without angina pectoris: Secondary | ICD-10-CM | POA: Diagnosis not present

## 2018-10-27 DIAGNOSIS — Z0000 Encounter for general adult medical examination without abnormal findings: Secondary | ICD-10-CM | POA: Diagnosis not present

## 2018-10-27 DIAGNOSIS — Z1389 Encounter for screening for other disorder: Secondary | ICD-10-CM | POA: Diagnosis not present

## 2018-11-02 ENCOUNTER — Encounter: Payer: Self-pay | Admitting: Pulmonary Disease

## 2018-11-02 ENCOUNTER — Ambulatory Visit: Payer: Medicare Other | Admitting: Pulmonary Disease

## 2018-11-02 VITALS — BP 122/62 | HR 90 | Ht 63.0 in | Wt 128.0 lb

## 2018-11-02 DIAGNOSIS — J470 Bronchiectasis with acute lower respiratory infection: Secondary | ICD-10-CM

## 2018-11-02 DIAGNOSIS — K219 Gastro-esophageal reflux disease without esophagitis: Secondary | ICD-10-CM | POA: Diagnosis not present

## 2018-11-02 DIAGNOSIS — J449 Chronic obstructive pulmonary disease, unspecified: Secondary | ICD-10-CM

## 2018-11-02 DIAGNOSIS — E871 Hypo-osmolality and hyponatremia: Secondary | ICD-10-CM | POA: Diagnosis not present

## 2018-11-02 DIAGNOSIS — J479 Bronchiectasis, uncomplicated: Secondary | ICD-10-CM

## 2018-11-02 NOTE — Progress Notes (Signed)
Subjective:    Patient ID: Elizabeth Sparks, female    DOB: [birth year], 77 y.o.   MRN: [id number]  Synopsis: Former patient of Dr. Gwenette Greet with bronchiectasis and COPD. Had pertussis in forth grade.  Significant smoking history Has chronic cough controlled with tramadol.  HPI Chief Complaint  Patient presents with  . Follow-up    pt states she is doing well, does note occasional prod cough with gray/brown mucus worse qam.     Elizabeth Sparks has been doing well since the last visit despite the fact that her husband died about 6 weeks ago.  She will occasionally produce some mucus but this is not a frequent problem.  She has not had bronchiectasis exacerbations, bronchitis, or pneumonia.  She says that she continues to take tramadol at night which helps with her cough.  She only uses it in the evenings.  She has not started any other narcotic medicines.  She unfortunately developed dropfoot and has been using a brace recently.  She was found to have peroneal nerve failure.   Past Medical History:  Diagnosis Date  . Arthritis   . Atrial fibrillation ([place]) 07/17/14  . Breast cancer ([place])   . Cancer ([place])    right breast  . Complication of anesthesia    has had ilius  . COPD (chronic obstructive pulmonary disease) ([place])   . GERD (gastroesophageal reflux disease)   . History of ongoing treatment with alendronate (Fosamax)   . Hypertension   . S/P radiation therapy comp. 02/10/12   42.5 Gy / 16 Fractions to Right Breast at [facility]  . Shortness of breath   . Thyroid disease   . Wheezing       Review of Systems  Constitutional: Negative for fever and unexpected weight change.  HENT: Negative for congestion, dental problem, ear pain, nosebleeds, postnasal drip, rhinorrhea, sinus pressure, sneezing, sore throat and trouble swallowing.   Eyes: Negative for redness and itching.  Respiratory: Negative for cough, chest tightness, shortness of breath and wheezing.     Cardiovascular: Negative for palpitations and leg swelling.  Skin: Negative for rash.       Objective:   Physical Exam Vitals:   11/02/18 1604  BP: 122/62  Pulse: 90  SpO2: 92%  Weight: 128 lb (58.1 kg)  Height: 5' 3"  (1.6 m)   RA  Gen: chronically ill appearing HENT: OP clear, TM's clear, neck supple PULM: CTA B, normal percussion CV: RRR, no mgr, trace edema GI: BS+, soft, nontender Derm: no cyanosis or rash Psyche: normal mood and affect    CBC    Component Value Date/Time   WBC 6.0 06/21/2017 1207   RBC 4.53 06/21/2017 1207   HGB 12.4 06/21/2017 1207   HCT 37.3 06/21/2017 1207   PLT 251 06/21/2017 1207   MCV 82.3 06/21/2017 1207   MCH 27.4 06/21/2017 1207   MCHC 33.2 06/21/2017 1207   RDW 13.5 06/21/2017 1207   LYMPHSABS 1.0 06/21/2017 1207   MONOABS 0.6 06/21/2017 1207   EOSABS 0.1 06/21/2017 1207   BASOSABS 0.0 06/21/2017 1207   Chest imaging: CT chest 2012:  +bronchiectasis esp RUL, + tree and bud c/w MAC colonization.  05/2015 HRCT > bronchiectasis (mild, upper lobe)and mild emphysema Esophagram 2016 showed gastroesophageal reflux to the level of the upper thoracic esophagus  Micro: Resp Cultures 12/2014:  Neg bacteria and neg AFB  PFT: PFT's 2010:  FEV1 1.39 (66%), ratio 63, +airtrapping, no restriction, DLCO 64%  Records from her  visit with Dr. Melvyn Novas in January reviewed where Fosamax was held due to gastroesophageal reflux and cough.     Assessment & Plan:   Bronchiectasis without acute exacerbation ([place]) - Plan: Fungus Culture with Smear,  MYCOBACTERIA, CULTURE, WITH FLUOROCHROME SMEAR, Respiratory or Resp and Sputum Culture  Acute bronchitis with bronchiectasis (HCC)  COPD GOLD II/ AB/bronchiectasis   Gastroesophageal reflux disease, esophagitis presence not specified  Discussion: Despite some musculoskeletal problems and major changes in her family this has been a stable interval for [name].  Her lungs have been doing fairly well.  She  is compliant with Symbicort and Spiriva.  Her flu shot is up-to-date.  Plan: Bronchiectasis: Please provide [place] with a sample of your mucus so that we can tested for bacterial, fungal, AFB organisms Continue Symbicort 2 puffs twice a day Continue Spiriva daily In the event of increasing cough or mucus production please call us right away so that we can call in an antibiotic Use the flutter valve in the event of an acute respiratory infection, I recommend 4 to 5 breaths, 4-5 times a day I am glad that you have already had a flu shot Practice good hand hygiene Stay active  Follow-up with [place] in 6 months or sooner if needed    Current Outpatient Medications:  .  acetaminophen (TYLENOL) 325 MG tablet, Take 325 mg by mouth every 6 (six) hours as needed., Disp: , Rfl:  .  aspirin 81 MG tablet, Take 81 mg by mouth daily. , Disp: , Rfl:  .  budesonide-formoterol (SYMBICORT) 160-4.5 MCG/ACT inhaler, Inhale 2 puffs into the lungs 2 (two) times daily.  , Disp: , Rfl:  .  calcium carbonate (OS-CAL) 600 MG TABS, Take 600 mg by mouth daily., Disp: , Rfl:  .  Cholecalciferol (VITAMIN D-3 PO), Take by mouth daily.  , Disp: , Rfl:  .  Cranberry 500 MG CAPS, Take 2 capsules by mouth daily., Disp: , Rfl:  .  diltiazem (CARDIZEM CD) 120 MG 24 hr capsule, Take 1 capsule (120 mg total) by mouth daily. Further refills to be obtained from PCP, Disp: 90 capsule, Rfl: 0 .  esomeprazole (NEXIUM) 20 MG capsule, Take 40 mg by mouth 2 (two) times daily before a meal. , Disp: , Rfl:  .  hydrochlorothiazide (HYDRODIURIL) 25 MG tablet, Take 25 mg by mouth daily.  , Disp: , Rfl:  .  KLOR-CON 10 10 MEQ tablet, Take 10 mEq by mouth daily. , Disp: , Rfl:  .  losartan (COZAAR) 100 MG tablet, Take 100 mg by mouth daily., Disp: , Rfl:  .  PROAIR HFA 108 (90 BASE) MCG/ACT inhaler, 2 puffs every 4 (four) hours as needed. , Disp: , Rfl: 4 .  Respiratory Therapy Supplies (FLUTTER) DEVI, Use as directed, Disp: 1 each, Rfl: 0 .   simvastatin (ZOCOR) 10 MG tablet, Take 10 mg by mouth at bedtime.  , Disp: , Rfl:  .  traMADol (ULTRAM) 50 MG tablet, One at bedtime if needed, Disp: 90 tablet, Rfl: 0 .  Tiotropium Bromide Monohydrate (SPIRIVA RESPIMAT) 2.5 MCG/ACT AERS, Inhale 2 puffs into the lungs daily., Disp: 2 Inhaler, Rfl: 0

## 2018-11-02 NOTE — Patient Instructions (Signed)
Bronchiectasis: Please provide [place] with a sample of your mucus so that we can tested for bacterial, fungal, AFB organisms Continue Symbicort 2 puffs twice a day Continue Spiriva daily In the event of increasing cough or mucus production please call us right away so that we can call in an antibiotic Use the flutter valve in the event of an acute respiratory infection, I recommend 4 to 5 breaths, 4-5 times a day I am glad that you have already had a flu shot Practice good hand hygiene Stay active  Follow-up with [place] in 6 months or sooner if needed

## 2018-11-14 DIAGNOSIS — E871 Hypo-osmolality and hyponatremia: Secondary | ICD-10-CM | POA: Diagnosis not present

## 2018-11-22 ENCOUNTER — Other Ambulatory Visit: Payer: Self-pay | Admitting: Internal Medicine

## 2018-11-22 DIAGNOSIS — Z1231 Encounter for screening mammogram for malignant neoplasm of breast: Secondary | ICD-10-CM

## 2018-12-19 DIAGNOSIS — F321 Major depressive disorder, single episode, moderate: Secondary | ICD-10-CM | POA: Diagnosis not present

## 2018-12-19 DIAGNOSIS — M4807 Spinal stenosis, lumbosacral region: Secondary | ICD-10-CM | POA: Diagnosis not present

## 2018-12-19 DIAGNOSIS — E871 Hypo-osmolality and hyponatremia: Secondary | ICD-10-CM | POA: Diagnosis not present

## 2018-12-28 ENCOUNTER — Ambulatory Visit: Payer: Medicare Other

## 2018-12-28 ENCOUNTER — Telehealth: Payer: Self-pay | Admitting: Pulmonary Disease

## 2018-12-28 MED ORDER — TRAMADOL HCL 50 MG PO TABS: ORAL_TABLET | ORAL | 0 refills | Status: DC

## 2018-12-28 NOTE — Telephone Encounter (Signed)
Called and spoke with pharmacist at [facility] drug, phoned in rx for Tramadol. Patient aware. Nothing further needed.

## 2018-12-28 NOTE — Telephone Encounter (Signed)
OK to refill

## 2018-12-28 NOTE — Telephone Encounter (Signed)
Call made to patient, requesting refill of tramadol.   Tramadol 50mg  one tablet at bedtime PRN Last filled 09/28/18 #90.  Last office visit 11/02/18.   BQ please advise if okay to refill tramadol.

## 2018-12-30 ENCOUNTER — Other Ambulatory Visit: Payer: Medicare Other

## 2018-12-30 DIAGNOSIS — J479 Bronchiectasis, uncomplicated: Secondary | ICD-10-CM | POA: Diagnosis not present

## 2018-12-30 DIAGNOSIS — J069 Acute upper respiratory infection, unspecified: Secondary | ICD-10-CM | POA: Diagnosis not present

## 2018-12-30 DIAGNOSIS — J449 Chronic obstructive pulmonary disease, unspecified: Secondary | ICD-10-CM | POA: Diagnosis not present

## 2018-12-30 DIAGNOSIS — E871 Hypo-osmolality and hyponatremia: Secondary | ICD-10-CM | POA: Diagnosis not present

## 2018-12-30 DIAGNOSIS — R509 Fever, unspecified: Secondary | ICD-10-CM | POA: Diagnosis not present

## 2019-01-16 ENCOUNTER — Other Ambulatory Visit: Payer: Self-pay

## 2019-01-16 DIAGNOSIS — J479 Bronchiectasis, uncomplicated: Secondary | ICD-10-CM

## 2019-01-18 DIAGNOSIS — D224 Melanocytic nevi of scalp and neck: Secondary | ICD-10-CM | POA: Diagnosis not present

## 2019-01-18 DIAGNOSIS — D692 Other nonthrombocytopenic purpura: Secondary | ICD-10-CM | POA: Diagnosis not present

## 2019-01-18 DIAGNOSIS — Z85828 Personal history of other malignant neoplasm of skin: Secondary | ICD-10-CM | POA: Diagnosis not present

## 2019-01-18 DIAGNOSIS — L821 Other seborrheic keratosis: Secondary | ICD-10-CM | POA: Diagnosis not present

## 2019-01-19 ENCOUNTER — Telehealth: Payer: Self-pay

## 2019-01-19 NOTE — Telephone Encounter (Signed)
The lab called and gave culture results. 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 BQ please review cultures and advise.

## 2019-02-16 LAB — MYCOBACTERIA,CULT W/FLUOROCHROME SMEAR
MICRO NUMBER:: 71012
SMEAR:: NONE SEEN
SPECIMEN QUALITY:: ADEQUATE

## 2019-02-16 LAB — FUNGUS CULTURE W SMEAR
MICRO NUMBER: 71011
SMEAR: NONE SEEN
SPECIMEN QUALITY: ADEQUATE

## 2019-02-16 LAB — RESPIRATORY CULTURE OR RESPIRATORY AND SPUTUM CULTURE
MICRO NUMBER: 71013
SPECIMEN QUALITY: ADEQUATE

## 2019-02-17 ENCOUNTER — Encounter: Payer: Self-pay | Admitting: General Surgery

## 2019-02-24 ENCOUNTER — Telehealth: Payer: Self-pay | Admitting: Pulmonary Disease

## 2019-02-24 NOTE — Telephone Encounter (Signed)
Primary Pulmonologist: McQuaid Last office visit and with whom: 11/02/2018 What do we see them for (pulmonary problems): bronchiectasis Last OV assessment/plan: Instructions      Return in about 6 months (around 05/03/2019).  Bronchiectasis: Please provide [place] with a sample of your mucus so that we can tested for bacterial, fungal, AFB organisms Continue Symbicort 2 puffs twice a day Continue Spiriva daily In the event of increasing cough or mucus production please call us right away so that we can call in an antibiotic Use the flutter valve in the event of an acute respiratory infection, I recommend 4 to 5 breaths, 4-5 times a day I am glad that you have already had a flu shot Practice good hand hygiene Stay active  Follow-up with [place] in 6 months or sooner if needed       Was appointment offered to patient (explain)?  no   Reason for call: Called and spoke with Amy [facility]) who stated pt has been running a low grade temp of 99.2, is pale in complexion. Per Amy, pt is not more SOB than normal and is not coughing more than normal knowing she has bronchiectasis.  Per Amy, pt has not been out of the house in 2 weeks (no recent travel) and has not had anyone coming into the house.  Amy also stated that pt mentioned her nose was stuffy and didn't know if it was coming from allergies. Due to the weekend coming up is why Amy wanted to call to see if anything needed to be done to help with pt's symptoms.  Amy did state that pt mentioned to her that she does not feel real bad.

## 2019-02-24 NOTE — Telephone Encounter (Signed)
For only low grade fever and no more resp symptoms than baseline - would make sure she pushes fluids . , tylenol As needed   Monitor for other sx of infection, urine ,etc   If cough/congestion increase please call our office   Make sure continues her pulmonary regimen with flutter, symbicort   Please contact office for sooner follow up if symptoms do not improve or worsen or seek emergency care   Make sure she has a follow up as recommeneded in May per last notes

## 2019-02-24 NOTE — Telephone Encounter (Signed)
Spoke with Elizabeth Sparks and notified of recs per TP  She verbalized understanding  Pt did not have appt pending so I have scheduled her for rov with BQ for 04/19/2019 at 11:45 am

## 2019-03-23 ENCOUNTER — Telehealth: Payer: Self-pay | Admitting: Pulmonary Disease

## 2019-03-23 DIAGNOSIS — R059 Cough, unspecified: Secondary | ICD-10-CM

## 2019-03-23 DIAGNOSIS — R05 Cough: Secondary | ICD-10-CM

## 2019-03-23 MED ORDER — TRAMADOL HCL 50 MG PO TABS: ORAL_TABLET | ORAL | 0 refills | Status: DC

## 2019-03-23 NOTE — Telephone Encounter (Signed)
Called and spoke with pt. Pt is needing a refill of Tramadol which she takes at bedtime to help with cough.  Aaron Edelman, please advise if you are okay with [place] refilling med for pt. Thanks!

## 2019-03-23 NOTE — Telephone Encounter (Signed)
03/23/2019 1150  Before prescribing the patient with tramadol, I have checked [place] PMP aware and the patients overdose risk score is 240. Patient has 3 providers prescribing controlled substances. Patient has used 1 pharmacies.    Please counseled the patient on the sedative effects of tramadol. Patient to use this medication sparingly and not when driving, drinking alcohol, or using additional sedative medications. Patient has been prescribed 90 tablets with no refills.   I have electronically sent the medications in.  Please contact the patient and notify them.  Wyn Quaker, FNP

## 2019-03-23 NOTE — Telephone Encounter (Signed)
Called spoke with patient, discussed with patient the sedating effects of Tramadol and advised that this medication be used sparingly and not when driving, drinking alcohol or using additional sedating medications.  Pt voiced her understanding.  Nothing further needed; will sign off.

## 2019-04-03 ENCOUNTER — Telehealth: Payer: Self-pay | Admitting: Pulmonary Disease

## 2019-04-03 NOTE — Telephone Encounter (Signed)
Attempted to call daughter Amy back but number listed went to a fax machine. Unable to reach. Trying to confirm if the patient has repeated the sputum sample from February. Order still available.

## 2019-04-03 NOTE — Telephone Encounter (Signed)
Attempted to call pt but unable to reach. Left message for pt to return call. 

## 2019-04-03 NOTE — Telephone Encounter (Signed)
Reviewing this account today,  Pt was suppose to have labs completed on 01/17/2019 for AFB culture It is still marked active; no additional info or notes on this matter At this time this has not be addressed Routing message to DOD to address.  TN can you please advise. Thank you

## 2019-04-03 NOTE — Telephone Encounter (Signed)
It looks like she had a positive culture in january and they probably wanted a second culture.

## 2019-04-03 NOTE — Telephone Encounter (Deleted)
Following up with message below Lab test for AFB culture for 01/17/19 is still marked active. No other note found at this time for lab results

## 2019-04-04 NOTE — Telephone Encounter (Signed)
Please see encounter from 2/6 as this is a duplicate encounter to that.

## 2019-04-04 NOTE — Telephone Encounter (Signed)
Called and spoke with pt's daughter Amy letting her know that it seems like a second culture was needing to be done since pt had the positive culture in January. Stated to her that I was going to put specimen cup and instructions up front for her to come pick up for pt. Amy expressed understanding. Culture cup and info has been placed in brown paper bag up front for pt. Nothing further needed.

## 2019-04-17 ENCOUNTER — Other Ambulatory Visit: Payer: Medicare Other

## 2019-04-17 DIAGNOSIS — J479 Bronchiectasis, uncomplicated: Secondary | ICD-10-CM

## 2019-04-18 ENCOUNTER — Encounter: Payer: Self-pay | Admitting: Primary Care

## 2019-04-18 ENCOUNTER — Ambulatory Visit (INDEPENDENT_AMBULATORY_CARE_PROVIDER_SITE_OTHER): Payer: Medicare Other | Admitting: Primary Care

## 2019-04-18 DIAGNOSIS — J449 Chronic obstructive pulmonary disease, unspecified: Secondary | ICD-10-CM

## 2019-04-18 DIAGNOSIS — J479 Bronchiectasis, uncomplicated: Secondary | ICD-10-CM

## 2019-04-18 NOTE — Patient Instructions (Addendum)
Bronchiectasis - Continue flutter valve 4-5 times a day - Awaiting sputum culture, results pending since Monday 5/4  COPD gold II - Continue Symbicort 2 puffs twice daily - Continue Spiriva 1 puff daily  Call pulmonary if you develop increased sputum production, purulent sputum or increased shortness of breath   Follow up in 3-4 months with Dr. Lake Bells or sooner if symptoms worsen     Bronchiectasis  Bronchiectasis is a condition in which the airways in the lungs (bronchi) are damaged and widened. The condition makes it hard for the lungs to get rid of mucus, and it causes mucus to gather in the bronchi. This condition often leads to lung infections, which can make the condition worse. What are the causes? You can be born with this condition or you can develop it later in life. Common causes of this condition include:  Cystic fibrosis.  Repeated lung infections, such as pneumonia or tuberculosis.  An object or other blockage in the lungs.  Breathing in fluid, food, or other objects (aspiration).  A problem with the immune system and lung structure that is present at birth (congenital). Sometimes the cause is not known. What are the signs or symptoms? Common symptoms of this condition include:  A daily cough that brings up mucus and lasts for more than 3 weeks.  Lung infections that happen often.  Shortness of breath and wheezing.  Weakness and fatigue. How is this diagnosed? This condition is diagnosed with tests, such as:  Chest X-rays or CT scans. These are done to check for changes in the lungs.  Breathing tests. These are done to check how well your lungs are working.  A test of a sample of your saliva (sputum culture). This test is done to check for infection.  Blood tests and other tests. These are done to check for related diseases or causes. How is this treated? Treatment for this condition depends on the severity of the illness and its cause. Treatment  may include:  Medicines that loosen mucus so it can be coughed up (expectorants).  Medicines that relax the muscles of the bronchi (bronchodilators).  Antibiotic medicines to prevent or treat infection.  Physical therapy to help clear mucus from the lungs. Techniques may include: ? Postural drainage. This is when you sit or lie in certain positions so that mucus can drain by gravity. ? Chest percussion. This involves tapping the chest or back with a cupped hand. ? Chest vibration. For this therapy, a hand or special equipment vibrates your chest and back.  Surgery to remove the affected part of the lung. This may be done in severe cases. Follow these instructions at home: Medicines  Take over-the-counter and prescription medicines only as told by your health care provider.  If you were prescribed an antibiotic medicine, take it as told by your health care provider. Do not stop taking the antibiotic even if you start to feel better.  Avoid taking sedatives and antihistamines unless your health care provider tells you to take them. These medicines tend to thicken the mucus in the lungs. Managing symptoms  Perform breathing exercises or techniques to clear your lungs as told by your health care provider.  Consider using a cold steam vaporizer or humidifier in your room or home to help loosen secretions.  If you have a cough that gets worse at night, try sleeping in a semi-upright position. General instructions  Get plenty of rest.  Drink enough fluid to keep your urine clear or pale  yellow.  Stay inside when pollution and ozone levels are high.  Stay up to date with vaccinations and immunizations.  Avoid cigarette smoke and other lung irritants.  Do not use any products that contain nicotine or tobacco, such as cigarettes and e-cigarettes. If you need help quitting, ask your health care provider.  Keep all follow-up visits as told by your health care provider. This is  important. Contact a health care provider if:  You cough up more sputum than before and the sputum is yellow or green in color.  You have a fever.  You cannot control your cough and are losing sleep. Get help right away if:  You cough up blood.  You have chest pain.  You have increasing shortness of breath.  You have pain that gets worse or is not controlled with medicines.  You have a fever and your symptoms suddenly get worse. Summary  Bronchiectasis is a condition in which the airways in the lungs (bronchi) are damaged and widened. The condition makes it hard for the lungs to get rid of mucus, and it causes mucus to gather in the bronchi.  Treatment usually includes therapy to help clear mucus from the lungs.  Stay up to date with vaccinations and immunizations. This information is not intended to replace advice given to you by your health care provider. Make sure you discuss any questions you have with your health care provider. Document Released: 09/27/2007 Document Revised: 01/04/2017 Document Reviewed: 01/04/2017 Elsevier Interactive Patient Education  2019 [employer].

## 2019-04-18 NOTE — Progress Notes (Signed)
Virtual Visit via Telephone Note  I connected with Elizabeth Sparks on 04/18/19 at  2:00 PM EDT by telephone and verified that I am speaking with the correct person using two identifiers.  Location: Patient: Home Provider: Office    I discussed the limitations, risks, security and privacy concerns of performing an evaluation and management service by telephone and the availability of in person appointments. I also discussed with the patient that there may be a patient responsible charge related to this service. The patient expressed understanding and agreed to proceed.   History of Present Illness: 78 year old female. PMH significant for bronchiectasis and COPD. Patient of Dr. Lake Bells. Maintained on Symbicort and Spiriva.   04/18/2019 Patient called today for 6 month follow-up visit. Care taker states that patient has had some chest tightness and shortness of breath but "not anything more than normal". Cough is baseline. Occasional productive cough with creamy yellow sputum. She does not experience cough everyday. Still using flutter valve. Uses rescue inhaler on average once a week. Treated with zpack in January for URI symptoms by her PCP. She was able to give a second AFB sputum sample this Monday, results pending.   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 Lake Bells or sooner if symptoms worsen   I discussed the assessment and treatment plan with the patient. The patient was provided an opportunity  to ask questions and all were answered. The patient agreed with the plan and demonstrated an understanding of the instructions.   The patient was advised to call back or seek an in-person evaluation if the symptoms worsen or if the condition fails to improve as anticipated.  I provided 22 minutes of non-face-to-face time during this encounter.   Martyn Ehrich, NP

## 2019-04-19 ENCOUNTER — Ambulatory Visit: Payer: Medicare Other | Admitting: Pulmonary Disease

## 2019-04-19 DIAGNOSIS — I10 Essential (primary) hypertension: Secondary | ICD-10-CM | POA: Diagnosis not present

## 2019-04-19 DIAGNOSIS — R269 Unspecified abnormalities of gait and mobility: Secondary | ICD-10-CM | POA: Diagnosis not present

## 2019-04-19 DIAGNOSIS — F321 Major depressive disorder, single episode, moderate: Secondary | ICD-10-CM | POA: Diagnosis not present

## 2019-04-19 DIAGNOSIS — F329 Major depressive disorder, single episode, unspecified: Secondary | ICD-10-CM | POA: Diagnosis not present

## 2019-04-20 DIAGNOSIS — J479 Bronchiectasis, uncomplicated: Secondary | ICD-10-CM | POA: Diagnosis not present

## 2019-04-24 DIAGNOSIS — M9901 Segmental and somatic dysfunction of cervical region: Secondary | ICD-10-CM | POA: Diagnosis not present

## 2019-04-24 DIAGNOSIS — M9902 Segmental and somatic dysfunction of thoracic region: Secondary | ICD-10-CM | POA: Diagnosis not present

## 2019-04-24 DIAGNOSIS — M531 Cervicobrachial syndrome: Secondary | ICD-10-CM | POA: Diagnosis not present

## 2019-04-24 DIAGNOSIS — M9903 Segmental and somatic dysfunction of lumbar region: Secondary | ICD-10-CM | POA: Diagnosis not present

## 2019-04-26 ENCOUNTER — Telehealth: Payer: Self-pay | Admitting: Pulmonary Disease

## 2019-04-26 DIAGNOSIS — J479 Bronchiectasis, uncomplicated: Secondary | ICD-10-CM

## 2019-04-26 DIAGNOSIS — J449 Chronic obstructive pulmonary disease, unspecified: Secondary | ICD-10-CM

## 2019-04-26 MED ORDER — SODIUM CHLORIDE 3 % IN NEBU: INHALATION_SOLUTION | RESPIRATORY_TRACT | 12 refills | Status: AC | PRN

## 2019-04-26 MED ORDER — AMOXICILLIN-POT CLAVULANATE 875-125 MG PO TABS: 1.0000 | ORAL_TABLET | Freq: Two times a day (BID) | ORAL | 0 refills | Status: DC

## 2019-04-26 NOTE — Telephone Encounter (Signed)
Will send in abx. Please order nebulizer, will add hypertonic saline treatments to her therapy regimen. Continue flutter device 3-4 times a day and mucinex twice a day. AFB sputum culture was negative.

## 2019-04-26 NOTE — Telephone Encounter (Signed)
Primary Pulmonologist: BQ Last office visit and with whom: 04/18/2019 with Derl Barrow, NP What do we see them for (pulmonary problems): bronchiectasis Last OV assessment/plan: Instructions    Bronchiectasis - Continue flutter valve 4-5 times a day - Awaiting sputum culture, results pending since Monday 5/4  COPD gold II - Continue Symbicort 2 puffs twice daily - Continue Spiriva 1 puff daily  Call pulmonary if you develop increased sputum production, purulent sputum or increased shortness of breath   Follow up in 3-4 months with Dr. Lake Bells or sooner if symptoms worsen        Was appointment offered to patient (explain)?  Pt and daughter Amy want recommendations   Reason for call: Called and spoke with pt's daughter Amy who states pt has had more crackling in chest which started 3 days ago. Amy stated that pt began having more SOB yesterday, 5/12 and pt has been using rescue inhaler every 4 hours. Pt also has been having violent coughing spells per Amy and due to this, pt has been having problems with her breathing.  Amy is wondering if pt might need to get a nebulizer as she feels like pt is not getting enough relief from the inhalers.  Amy states that pt is not running any fever or chills. Pt does have body aches due to arthritis.   A concern that Amy has is that pt stated within the last week that pt stated nothing was tasting right to her and due to this pt is not really eating well. Amy stated that pt did have a televisit with PCP in regards to this and Amy stated after the visit, Dr. Delfina Redwood put DNR in place for pt.  Amy is wanting recommendations for pt. Beth, please advise on it for pt and daughter Amy. Thanks!

## 2019-04-26 NOTE — Telephone Encounter (Signed)
Called and spoke with pt's daughter Amy letting her know the info from [facility]. Stated to Amy that we have placed an order for nebulizer for pt so once they received that from DME, pt can begin the nebulizer treatments and Amy verbalized understanding.nothing further needed.

## 2019-04-28 ENCOUNTER — Telehealth: Payer: Self-pay | Admitting: Pulmonary Disease

## 2019-04-28 MED ORDER — AMOXICILLIN-POT CLAVULANATE 400-57 MG/5ML PO SUSR: 10.0000 mL | Freq: Two times a day (BID) | ORAL | 0 refills | Status: AC

## 2019-04-28 NOTE — Telephone Encounter (Signed)
Strengths verified by pharmacy.  Liquid augmentin 400/57 approved by [facility].

## 2019-04-28 NOTE — Telephone Encounter (Signed)
Pt had televisit with Derl Barrow, NP 04/18/2019.   Pt was prescribed Augmentin 875-125mg  5/13 by Derl Barrow after pt's daughter Amy called office stating that pt was not feeling well.  Called and spoke with pt's daughter Amy who is requesting pt's abx to be changed to a liquid abx as pt has been getting choked while trying to take the Augmentin tablet.  Beth, please advise on this for pt and daughter Amy. Thanks!

## 2019-04-28 NOTE — Telephone Encounter (Signed)
Elizabeth Sparks can you call pharmacy and ask if they have augmentin suspension  (liquid) and what dose would be equivalent

## 2019-05-23 ENCOUNTER — Telehealth: Payer: Self-pay | Admitting: Pulmonary Disease

## 2019-05-23 NOTE — Telephone Encounter (Signed)
Nebulized medications can be expensive, will try to send through DME company. If they are unable to be filled please notify office and we can try something else. Please make a telephone visit in 1 week to follow-up. If symptoms worsen call sooner or present to ED.   Recommend: Brovana 3mcg/2ml twice daily Pulmicort 0.25mg /44ml twice daily  Atrovent 0.02%- take 2.74ml(0.5mg ) 3 times daily

## 2019-05-23 NOTE — Telephone Encounter (Signed)
Returned phone call to Elizabeth Sparks (dpr), she states she is having a lot of trouble using her inhalers and she feels that she is just not strong enough to actually inhale the medications. She wanted to know whether or not symbicort or spiriva comes in a nebulizer form. She state she does okay with the nebulizer. She states she is declining quickly and she is not doing well. She states her husband recently passed and the patient has just given up. I offered a tele-visit stating that is sounds like the patient may need resources for more support such as home care, palliative care, etc. Daughter states she would rather we ask Beth first and get back with her.   Beth please advise. Thanks.

## 2019-05-24 MED ORDER — BUDESONIDE 0.25 MG/2ML IN SUSP: 0.2500 mg | Freq: Two times a day (BID) | RESPIRATORY_TRACT | 6 refills | Status: DC

## 2019-05-24 MED ORDER — ARFORMOTEROL TARTRATE 15 MCG/2ML IN NEBU: 15.0000 ug | INHALATION_SOLUTION | Freq: Two times a day (BID) | RESPIRATORY_TRACT | 6 refills | Status: DC

## 2019-05-24 MED ORDER — ARFORMOTEROL TARTRATE 15 MCG/2ML IN NEBU: 15.0000 ug | INHALATION_SOLUTION | Freq: Two times a day (BID) | RESPIRATORY_TRACT | 5 refills | Status: AC

## 2019-05-24 MED ORDER — IPRATROPIUM BROMIDE 0.02 % IN SOLN: 0.5000 mg | Freq: Three times a day (TID) | RESPIRATORY_TRACT | 5 refills | Status: AC

## 2019-05-24 MED ORDER — IPRATROPIUM BROMIDE 0.02 % IN SOLN: 0.5000 mg | Freq: Three times a day (TID) | RESPIRATORY_TRACT | 6 refills | Status: DC

## 2019-05-24 MED ORDER — BUDESONIDE 0.25 MG/2ML IN SUSP: 0.2500 mg | Freq: Two times a day (BID) | RESPIRATORY_TRACT | 5 refills | Status: DC

## 2019-05-24 NOTE — Addendum Note (Signed)
Addended by: Karmen Stabs on: 05/24/2019 04:26 PM   Modules accepted: Orders

## 2019-05-24 NOTE — Telephone Encounter (Signed)
Spoke with Amy and relayed info on nebs and sent them to [facility] to be billed to Part B.  If anything is too expensive or any problems, I instructed her to call us back.

## 2019-05-24 NOTE — Telephone Encounter (Signed)
Spoke with amy again and relayed that [facility] does not bill part B and the scripts are at [facility] in [place].

## 2019-05-24 NOTE — Telephone Encounter (Signed)
lmtcb x1 Amy Maness (DPR)

## 2019-05-24 NOTE — Telephone Encounter (Signed)
Pt daughter returning call to Lattie Haw she can be reached @ [phone].Hillery Hunter

## 2019-05-25 DIAGNOSIS — J449 Chronic obstructive pulmonary disease, unspecified: Secondary | ICD-10-CM | POA: Diagnosis not present

## 2019-06-05 LAB — AFB CULTURE WITH SMEAR (NOT AT ARMC)
Acid Fast Culture: NEGATIVE
Acid Fast Smear: NEGATIVE

## 2019-06-19 ENCOUNTER — Telehealth: Payer: Self-pay | Admitting: Pulmonary Disease

## 2019-06-19 DIAGNOSIS — J449 Chronic obstructive pulmonary disease, unspecified: Secondary | ICD-10-CM | POA: Diagnosis not present

## 2019-06-19 DIAGNOSIS — R05 Cough: Secondary | ICD-10-CM

## 2019-06-19 DIAGNOSIS — R059 Cough, unspecified: Secondary | ICD-10-CM

## 2019-06-19 NOTE — Telephone Encounter (Signed)
Attempted to call pt's daughter Amy but unable to reach. Left message for Amy to return call.

## 2019-06-20 DIAGNOSIS — R1310 Dysphagia, unspecified: Secondary | ICD-10-CM | POA: Diagnosis not present

## 2019-06-20 DIAGNOSIS — R471 Dysarthria and anarthria: Secondary | ICD-10-CM | POA: Diagnosis not present

## 2019-06-20 DIAGNOSIS — Q898 Other specified congenital malformations: Secondary | ICD-10-CM | POA: Diagnosis not present

## 2019-06-20 DIAGNOSIS — R531 Weakness: Secondary | ICD-10-CM | POA: Diagnosis not present

## 2019-06-20 MED ORDER — TRAMADOL HCL 50 MG PO TABS: ORAL_TABLET | ORAL | 0 refills | Status: DC

## 2019-06-20 NOTE — Telephone Encounter (Signed)
Primary Pulmonologist: BQ Last office visit and with whom: 04/18/2019 with Derl Barrow What do we see them for (pulmonary problems): bronchiectasis Last OV assessment/plan: Instructions   Bronchiectasis - Continue flutter valve 4-5 times a day - Awaiting sputum culture, results pending since Monday 5/4  COPD gold II - Continue Symbicort 2 puffs twice daily - Continue Spiriva 1 puff daily  Call pulmonary if you develop increased sputum production, purulent sputum or increased shortness of breath   Follow up in 3-4 months with Dr. Lake Bells or sooner if symptoms worsen      Was appointment offered to patient (explain)?  Pt's daughter wants instructions changed on pt's Tramadol Rx   Reason for call: Called and spoke with pt's daughter Amy to figure out if pt is taking the Tramadol for pain or for cough and per Amy, pt takes the Tramadol for a cough.  Amy stated that pt is currently taking the Tramadol at night before she goes to bed but per Amy, pt is coughing more during the day and due to this, Amy is wanting to know if instructions for the Tramadol Rx could be changed.  Amy stated that pt's cough has been worse x2 weeks. Amy stated that pt is getting to where she is able to cough up clear mucus. Amy stated when pt does have a bad coughing spell, pt does become SOB but no worsening SOB other than that.  Amy stated that pt has not had any fever.  Pt does not have any complaints of sore throat, no chest tightness, no nausea/vomiting.  Pt has not had to be tested for COVID nor has she been around anyone that has been sick.  Routing encounter to [employer] as she is last APP that saw pt. Beth, please advise if you are okay with [place] changing the instructions on pt's Tramadol Rx.  Instructions from previous Rx which was ordered on 4/9 states for pt to take one tablet (50mg ) at bedtime if needed and a quantity of 90 tabs was ordered with 0 RF.  Please advise if pt can have instructions  changed to where she can take it during the day as needed (whether it be every 6 hours as needed) and if we can add any refills on Rx for pt. Thanks!

## 2019-06-20 NOTE — Telephone Encounter (Signed)
Sent. Make sure she is using delsym twice daily and try to wean off tramadol when cough improved

## 2019-06-20 NOTE — Telephone Encounter (Signed)
Attempted to call pt's daughter Amy but unable to reach. Left message for her to return call.

## 2019-06-20 NOTE — Telephone Encounter (Signed)
Left message for Elizabeth Sparks to call back.  

## 2019-06-20 NOTE — Telephone Encounter (Signed)
pts daughter AMY returning phone call

## 2019-06-21 NOTE — Telephone Encounter (Signed)
ATC pt, I did not receive an answer and I could not leave a message.  Will try back.

## 2019-06-21 NOTE — Telephone Encounter (Signed)
LMTCB x2 for pt's daughter, Amy.

## 2019-06-21 NOTE — Telephone Encounter (Signed)
Pt returning call and can be reached @ [phone].Elizabeth Sparks

## 2019-06-22 NOTE — Telephone Encounter (Signed)
Attempted to call pt but unable to reach and unable to leave a VM due to receiving a busy signal. Will try to call back later.

## 2019-06-23 NOTE — Telephone Encounter (Signed)
ATC, NA and no VM  Will close encounter and multiple failed attempts to reach this pt

## 2019-06-26 ENCOUNTER — Telehealth: Payer: Self-pay | Admitting: Pulmonary Disease

## 2019-06-26 DIAGNOSIS — R059 Cough, unspecified: Secondary | ICD-10-CM

## 2019-06-26 DIAGNOSIS — R05 Cough: Secondary | ICD-10-CM

## 2019-06-26 NOTE — Telephone Encounter (Signed)
Called & spoke w/ pt's daughter, Elizabeth Sparks (on [place]). Elizabeth Sparks is requesting the pt's tramadol prescription to be written for during the day and not just at bedtime d/t the pt's constant daily cough. Elizabeth Sparks reports pt takes Mucinex, but not on a regular basis because pt has difficulty swallowing. She further notes that pt takes crushed up tablets in yogurt or applesauce consistency and that pt's PCP has ordered tests to rule out the cause of her dysphagia.   Elizabeth Sparks states the Tramadol tablet is small enough to where it is easy for the pt to swallow. I let Elizabeth Sparks know I would route this message to [facility], who the patient saw last, and get back with her tomorrow morning 06/27/2019 as it is after 5 PM and Beth is no longer in the office for the day. Elizabeth Sparks expressed understanding with no additional questions.   Routing to [place] for follow-up. Beth, please advise with your recommendations for this pt. Thank you.

## 2019-06-27 ENCOUNTER — Other Ambulatory Visit: Payer: Self-pay | Admitting: Internal Medicine

## 2019-06-27 ENCOUNTER — Other Ambulatory Visit (HOSPITAL_COMMUNITY): Payer: Self-pay | Admitting: *Deleted

## 2019-06-27 ENCOUNTER — Ambulatory Visit: Payer: Medicare Other | Admitting: Neurology

## 2019-06-27 DIAGNOSIS — G319 Degenerative disease of nervous system, unspecified: Secondary | ICD-10-CM

## 2019-06-27 DIAGNOSIS — R1310 Dysphagia, unspecified: Secondary | ICD-10-CM

## 2019-06-27 MED ORDER — TRAMADOL HCL 50 MG PO TABS: ORAL_TABLET | ORAL | 0 refills | Status: DC

## 2019-06-27 NOTE — Telephone Encounter (Signed)
Called and spoke with pt's daughter Amy letting her know the info stated by [facility] and that pt can take tramadol tid prn cough. Also stated to Amy that Rx was sent to pharmacy for pt. Amy verbalized understanding. Nothing further needed.

## 2019-06-27 NOTE — Telephone Encounter (Signed)
It appears the prescription is TID in the chart, I confirmed that she has not had tramadol filled since June 13th. Will re-sent prescription #[id number].

## 2019-06-28 DIAGNOSIS — G5600 Carpal tunnel syndrome, unspecified upper limb: Secondary | ICD-10-CM | POA: Diagnosis not present

## 2019-06-28 DIAGNOSIS — G629 Polyneuropathy, unspecified: Secondary | ICD-10-CM | POA: Diagnosis not present

## 2019-07-03 ENCOUNTER — Encounter: Payer: Self-pay | Admitting: Neurology

## 2019-07-03 DIAGNOSIS — G629 Polyneuropathy, unspecified: Secondary | ICD-10-CM | POA: Diagnosis not present

## 2019-07-09 ENCOUNTER — Other Ambulatory Visit: Payer: Self-pay

## 2019-07-09 ENCOUNTER — Ambulatory Visit
Admission: RE | Admit: 2019-07-09 | Discharge: 2019-07-09 | Disposition: A | Discharge status: Home / Self Care | Payer: Medicare Other | Source: Ambulatory Visit | Attending: Internal Medicine | Admitting: Internal Medicine

## 2019-07-09 DIAGNOSIS — G319 Degenerative disease of nervous system, unspecified: Secondary | ICD-10-CM

## 2019-07-09 DIAGNOSIS — R262 Difficulty in walking, not elsewhere classified: Secondary | ICD-10-CM | POA: Diagnosis not present

## 2019-07-09 DIAGNOSIS — M4802 Spinal stenosis, cervical region: Secondary | ICD-10-CM | POA: Diagnosis not present

## 2019-07-09 DIAGNOSIS — R1310 Dysphagia, unspecified: Secondary | ICD-10-CM | POA: Diagnosis not present

## 2019-07-11 ENCOUNTER — Ambulatory Visit (HOSPITAL_COMMUNITY)
Admission: RE | Admit: 2019-07-11 | Discharge: 2019-07-11 | Disposition: A | Discharge status: Home / Self Care | Payer: Medicare Other | Source: Ambulatory Visit | Attending: Internal Medicine | Admitting: Internal Medicine

## 2019-07-11 DIAGNOSIS — R1310 Dysphagia, unspecified: Secondary | ICD-10-CM | POA: Diagnosis not present

## 2019-07-11 DIAGNOSIS — R05 Cough: Secondary | ICD-10-CM | POA: Diagnosis not present

## 2019-07-11 DIAGNOSIS — K219 Gastro-esophageal reflux disease without esophagitis: Secondary | ICD-10-CM | POA: Diagnosis not present

## 2019-07-18 DIAGNOSIS — J449 Chronic obstructive pulmonary disease, unspecified: Secondary | ICD-10-CM | POA: Diagnosis not present

## 2019-07-22 ENCOUNTER — Other Ambulatory Visit: Payer: Medicare Other

## 2019-07-25 ENCOUNTER — Other Ambulatory Visit: Payer: Self-pay | Admitting: Pulmonary Disease

## 2019-07-25 DIAGNOSIS — R05 Cough: Secondary | ICD-10-CM

## 2019-07-25 DIAGNOSIS — R059 Cough, unspecified: Secondary | ICD-10-CM

## 2019-07-25 MED ORDER — TRAMADOL HCL 50 MG PO TABS: ORAL_TABLET | ORAL | 0 refills | Status: AC

## 2019-07-25 NOTE — Progress Notes (Signed)
NEUROLOGY FOLLOW UP OFFICE NOTE  Elizabeth Sparks [id number]  HISTORY OF PRESENT ILLNESS: Elizabeth Sparks is a 78 year old right-handed female with atrial fibrillation, coronary artery disease, hypercholesterolemia, COPD, hypertension, thyroid disease, and history of right breast cancer who follows up for neurologic decline.  In late May 2019, she developed left foot drop.  She initially walked with a cane and then started having falls.  So she started using a walker.  She notes her hips bother her.  She has associated burning in the left foot.  MRI of lumbar spine from 08/10/18 was personally reviewed and demonstrated advanced multilevel lumbar disc and facet disease resulting in moderate spinal stenosis at L3-4, mild spinal stenosis at L2-3 and L4-5, moderate left neural foraminal stenosis at L3-4 and severe L5-S1 facet arthrosis with mild lateral recess stenosis.  She had a an NCV/EMG of the left lower extremity which reportedly showed a peroneal neuropathy.  She did physical therapy for 5 weeks which was ineffective.  She denies crossing her legs.  She does not have history of diabetes.  She has no prior history of foot drop or other compressive neuropathies.    Since around January 2020, she has had a fairly rapid decline leading to failure to thrive.  She started having increased difficulty with balance and ambulation.  She subsequently became weaker and is now confined to a wheelchair.  She has contractures of both the upper and lower extremities.  She is now unable to move her extremities.  Denies double vision.  She has had a progressive decline in speech to the point that she is barely able to talk.  She has had progressive dysphagia and is now only able to consume honey-thickened liquids.    MRI of brain without contrast from 07/09/19 showed mild atrophy and chronic small vessel ischemic changes in white matter and pons but no acute intracranial abnormality.  MRI of cervical spine showed  only degenerative disc and facet disease, most notable at C6-7 causing right foraminal stenosis, but no compressive cord lesion or other evidence of myelopathy.  She underwent modified barium swallow on 07/11/19 which demonstrated oropharyngeal phase dysphagia.  She now lives with her daughter who cares for her and assists her with all activities of daily living.  She does report pain due to muscle cramps in her extremities.  She takes tramadol.  Possibility of motor neuron disease has been discussed with her PCP.  She is DNR.  She underwent NCV-EMG last month on 06/28/19 and 07/03/19.  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  Cervical and thoracic paraspinal muscles were unremarkable and reportedly did not demonstrate evidence of motor neuron disease.  Due to contractures, myopathy could not be ruled out.   PAST MEDICAL HISTORY: Past Medical History:  Diagnosis Date   Arthritis    Atrial fibrillation ([place]) 07/17/14   Breast cancer ([place])    Cancer ([place])    right breast   Complication of anesthesia    has had ilius   COPD (chronic obstructive pulmonary disease) ([facility])    GERD (gastroesophageal reflux disease)    History of ongoing treatment with alendronate (Fosamax)    Hypertension    S/P radiation therapy comp. 02/10/12   42.5 Gy / 16 Fractions to Right Breast at [facility] [facility] of breath    Thyroid disease  Wheezing     MEDICATIONS: Current Outpatient Medications on File Prior to Visit  Medication Sig Dispense Refill   acetaminophen (TYLENOL) 325 MG tablet Take 325 mg by mouth every 6 (six) hours as needed.     arformoterol (BROVANA) 15 MCG/2ML NEBU Take 2 mLs (15 mcg total) by nebulization 2 (two) times daily. 120 mL 5   aspirin 81 MG tablet Take  81 mg by mouth daily.      budesonide (PULMICORT) 0.25 MG/2ML nebulizer solution Take 2 mLs (0.25 mg total) by nebulization 2 (two) times daily. 120 mL 5   budesonide-formoterol (SYMBICORT) 160-4.5 MCG/ACT inhaler Inhale 2 puffs into the lungs 2 (two) times daily.       calcium carbonate (OS-CAL) 600 MG TABS Take 600 mg by mouth daily.     Cholecalciferol (VITAMIN D-3 PO) Take by mouth daily.       Cranberry 500 MG CAPS Take 2 capsules by mouth daily.     diltiazem (CARDIZEM CD) 120 MG 24 hr capsule Take 1 capsule (120 mg total) by mouth daily. Further refills to be obtained from PCP 90 capsule 0   esomeprazole (NEXIUM) 20 MG capsule Take 40 mg by mouth 2 (two) times daily before a meal.      hydrochlorothiazide (HYDRODIURIL) 25 MG tablet Take 25 mg by mouth daily.       ipratropium (ATROVENT) 0.02 % nebulizer solution Take 2.5 mLs (0.5 mg total) by nebulization 3 (three) times daily. 225 mL 5   KLOR-CON 10 10 MEQ tablet Take 10 mEq by mouth daily.      losartan (COZAAR) 100 MG tablet Take 100 mg by mouth daily.     potassium chloride (K-DUR) 10 MEQ tablet Take 10 mEq by mouth daily.     pregabalin (LYRICA) 50 MG capsule Take 50 mg by mouth daily.     PROAIR HFA 108 (90 BASE) MCG/ACT inhaler 2 puffs every 4 (four) hours as needed.   4   Respiratory Therapy Supplies (FLUTTER) DEVI Use as directed 1 each 0   simvastatin (ZOCOR) 10 MG tablet Take 10 mg by mouth at bedtime.       sodium chloride HYPERTONIC 3 % nebulizer solution Take by nebulization as needed for cough. Use 1-2 times a day as needed for chest congestion 750 mL 12   Tiotropium Bromide Monohydrate (SPIRIVA RESPIMAT) 2.5 MCG/ACT AERS Inhale 2 puffs into the lungs daily. 2 Inhaler 0   traMADol (ULTRAM) 50 MG tablet Take 1 tab TID prn cough 90 tablet 0   No current facility-administered medications on file prior to visit.     ALLERGIES: Allergies  Allergen Reactions   Codeine     REACTION: headache and nausea     Evista [Raloxifene]    Procaine Hcl     REACTION: headaches    FAMILY HISTORY: Family History  Problem Relation Age of Onset   Heart disease Mother    Cancer Father        prostate   Heart disease Father    Cancer Maternal Grandmother        liver   SOCIAL HISTORY: Social History   Socioeconomic History   Marital status: Widowed    Spouse name: Not on file   Number of children: Not on file   Years of education: Not on file   Highest education level: 10th grade  Occupational History   Occupation: retired  Scientist, product/process development strain: Not on [employer] insecurity  Worry: Not on file    Inability: Not on file   Transportation needs    Medical: Not on file    Non-medical: Not on file  Tobacco Use   Smoking status: Former Smoker    Packs/day: 0.50    Years: 45.00    Pack years: 22.50    Types: Cigarettes    Quit date: 10/28/2009    Years since quitting: 9.7   Smokeless tobacco: Never Used  Substance and Sexual Activity   Alcohol use: Yes    Alcohol/week: 2.0 standard drinks    Types: 2 Glasses of wine per week   Drug use: No   Sexual activity: Yes    Birth control/protection: Post-menopausal  Lifestyle   Physical activity    Days per week: Not on file    Minutes per session: Not on file   Stress: Not on file  Relationships   Social connections    Talks on phone: Not on file    Gets together: Not on file    Attends religious service: Not on file    Active member of club or organization: Not on file    Attends meetings of clubs or organizations: Not on file    Relationship status: Not on file   Intimate partner violence    Fear of current or ex partner: Not on file    Emotionally abused: Not on file    Physically abused: Not on file    Forced sexual activity: Not on file  Other Topics Concern   Not on file  Social History Narrative   Patient is right-handed. She lives with her daughter in a 2 story home, though  she remains on the main level.     REVIEW OF SYSTEMS: Constitutional: No fever, chills Ear, nose and throat: dysphagia Cardiovascular: No chest pain, palpitations Respiratory:  No shortness of breath at rest or with exertion, wheezes GastrointestinaI: Dysphagia.  No nausea, vomiting Genitourinary:  No dysuria Musculoskeletal:  Diffuse myalgias Integumentary: No rash, pruritus, skin lesions Neurological: as above Psychiatric: Depression Endocrine: No palpitations, fatigue, diaphoresis, mood swings, change in appetite, change in weight, increased thirst Hematologic/Lymphatic:  No purpura, petechiae. Allergic/Immunologic: no itchy/runny eyes, nasal congestion, recent allergic reactions, rashes  PHYSICAL EXAM: Blood pressure 135/71, pulse 100, temperature 98.7 F (37.1 C), SpO2 92 %. General: No acute distress.  Patient appears well-groomed.   Head:  Normocephalic/atraumatic Eyes:  Fundi examined but not visualized Neck: supple, no paraspinal tenderness, full range of motion Heart:  Regular rate and rhythm Lungs:  Clear to auscultation bilaterally Back: No paraspinal tenderness Neurological Exam: alert and oriented.  Attention span and concentration intact, recent and remote memory intact, fund of knowledge intact.  Speech fluent and not dysarthric, language intact.  Severe hypophonia and difficulty of speaking.  Limited movement of facial muscles.  Able to track with eyes except slow.  Decreased gag.  Otherwise, CN II-XII grossly intact. Reduced bulk and increased tone with contractures noted in all extremities but no fasciculations, including the tongue.  2+/5 upper extremities, 1+/5 lower extremities.  Sensation to temperature and vibration grossly intact.  Deep tendon reflexes 3+ throughout, bilateral Hoffman, toes downgoing.  Non-ambulatory.  IMPRESSION: 78 year old female with subacute neurologic decline over the past year, beginning with foot drop and now with diffuse weakness and  spasticity, dysphagia, dysphonia and difficulty with speech.  Motor neuron disease is highest on my differential.  Recent NCV-EMG did not demonstrate evidence of motor neuron disease but did demonstrate significant  polyneuropathy.  She may have underlying peripheral neuropathy given her age, but this is not the cause of her symptoms. 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     PLAN: She is DNR and declines any procedures that would prolong life (such as need for a G-tube as her dysphagia progresses).  Therefore, management will be focused on comfort care. 1.  I will order a CK.  If this is mildly elevated, this may support diagnosis of ALS 2.  I would typically repeat EMG at our office with our neuromuscular specialist, Dr. Posey Pronto.However, since she just had the study performed, and ince this would only support a diagnosis and not change management, they have deferred repeating the study, which I feel is appropriate. 3.  For muscle cramps, I will prescribe baclofen 4.  Defers antidepressant 5.  I will place an order for hospice/palliative care.  I answered all questions to the best of my ability.  Metta Clines, DO  CC: Seward Carol, MD

## 2019-07-25 NOTE — Telephone Encounter (Signed)
Pt's daughter Amy called requesting to have pt's Tramadol Rx refilled. Beth, please advise if you are okay refilling med for pt. I have pended med for you to be able to review.

## 2019-07-25 NOTE — Telephone Encounter (Signed)
Ok to refill. PMP reviewed, no unexpected prescriptions found. Over dose risk score 130.

## 2019-07-26 ENCOUNTER — Other Ambulatory Visit: Payer: Self-pay

## 2019-07-26 ENCOUNTER — Other Ambulatory Visit (INDEPENDENT_AMBULATORY_CARE_PROVIDER_SITE_OTHER): Payer: Medicare Other

## 2019-07-26 ENCOUNTER — Encounter: Payer: Self-pay | Admitting: Neurology

## 2019-07-26 ENCOUNTER — Ambulatory Visit (INDEPENDENT_AMBULATORY_CARE_PROVIDER_SITE_OTHER): Payer: Medicare Other | Admitting: Neurology

## 2019-07-26 VITALS — BP 135/71 | HR 100 | Temp 98.7°F

## 2019-07-26 DIAGNOSIS — R531 Weakness: Secondary | ICD-10-CM | POA: Diagnosis not present

## 2019-07-26 DIAGNOSIS — R252 Cramp and spasm: Secondary | ICD-10-CM

## 2019-07-26 DIAGNOSIS — R1312 Dysphagia, oropharyngeal phase: Secondary | ICD-10-CM

## 2019-07-26 DIAGNOSIS — G1220 Motor neuron disease, unspecified: Secondary | ICD-10-CM | POA: Diagnosis not present

## 2019-07-26 MED ORDER — BACLOFEN 10 MG PO TABS: 10.0000 mg | ORAL_TABLET | Freq: Three times a day (TID) | ORAL | 1 refills | Status: AC

## 2019-07-26 NOTE — Patient Instructions (Addendum)
For muscle cramps, I prescribed you baclofen 10mg  tablet.  May take 1 tablet three times daily.  First take 1/2 tablet to see if that is effective.  Will discuss you case and nerve test with my colleague, Dr. Posey Pronto Will check CK level Follow up soon.  Your provider has requested that you have labwork completed today. Please go to [facility] ([id number]) on the second floor of this building before leaving the office today. You do not need to check in. If you are not called within 15 minutes please check with the front desk.

## 2019-07-27 ENCOUNTER — Telehealth: Payer: Self-pay

## 2019-07-27 ENCOUNTER — Encounter: Payer: Self-pay | Admitting: Neurology

## 2019-07-27 LAB — CK: Total CK: 43 U/L (ref 7–177)

## 2019-07-27 NOTE — Telephone Encounter (Signed)
[place], spoke with Ron. Faxed referral, he will contact Amy, Pt's daughter.

## 2019-07-27 NOTE — Addendum Note (Signed)
Addended by: Clois Comber on: 07/27/2019 02:04 PM   Modules accepted: Orders

## 2019-07-31 ENCOUNTER — Telehealth: Payer: Self-pay

## 2019-07-31 NOTE — Telephone Encounter (Signed)
Pt's daughter Amy called back to let us know she rcvd message and that Hospice had came to evaluate her Mom, and thanked [place]. I told her to call if she had any questions.

## 2019-07-31 NOTE — Telephone Encounter (Signed)
-----   Message from Pieter Partridge, DO sent at 07/28/2019  7:31 AM EDT ----- CK is normal.

## 2019-07-31 NOTE — Telephone Encounter (Signed)
Called [facility] advising lab results are normal.

## 2019-08-15 DEATH — deceased

## 2020-12-14 IMAGING — RF SWALLOWING FUNCTION
12 of 16 series · 12 of 24 positions shown · non-contrast
Comparison: None.

CLINICAL DATA: Dysphagia. Cough/GE reflux disease/other secondary
diagnosis

EXAM:
MODIFIED BARIUM SWALLOW
TECHNIQUE: Different consistencies of barium were administered orally to the
patient by the Speech Pathologist. Imaging of the pharynx was
performed in the lateral projection. The radiologist was present in
the fluoroscopy room for this study, providing personal supervision.
FLUOROSCOPY TIME:  Fluoroscopy Time:  1 minutes 11 seconds

[Series 1: run · 1 of 27 frames shown (1 of 12)]
[frame 23/27]
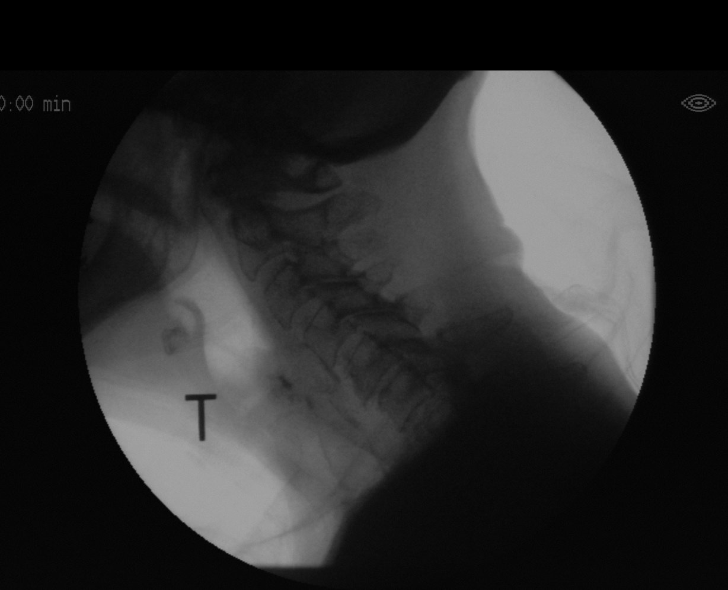

[Series 3: run · 1 of 19 frames shown (2 of 12)]
[frame 3/19]
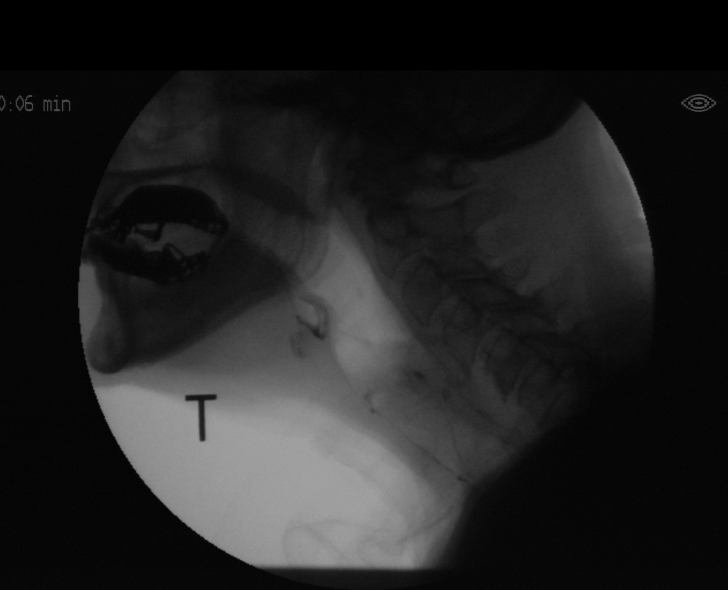

[Series 4: run · 1 of 227 frames shown (3 of 12)]
[frame 42/227]
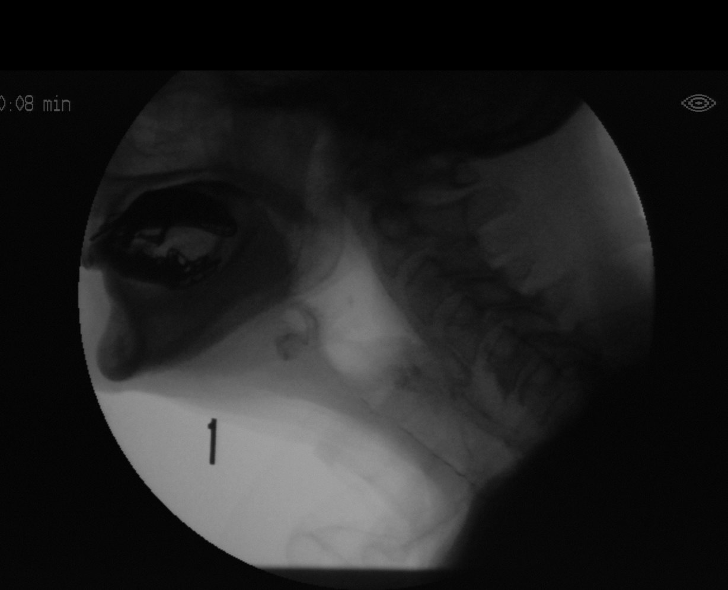

[Series 5: run · 1 of 176 frames shown (4 of 12)]
[frame 150/176]
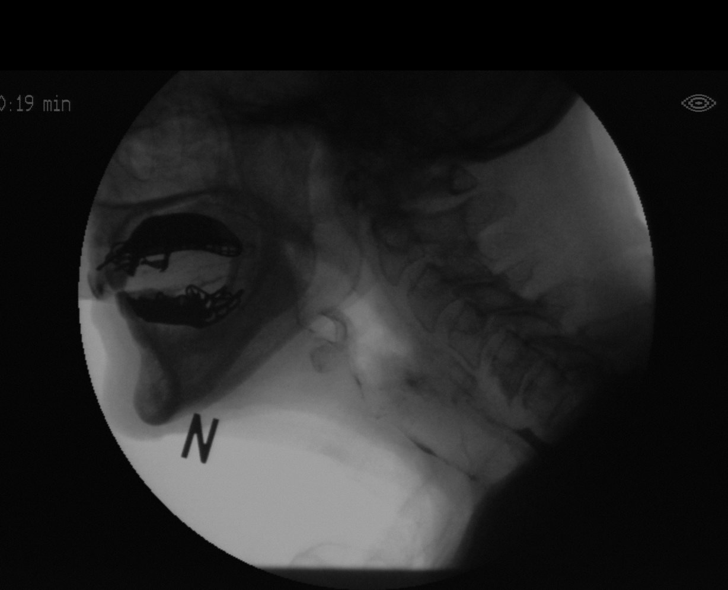

[Series 7: run · 1 of 167 frames shown (5 of 12)]
[frame 26/167]
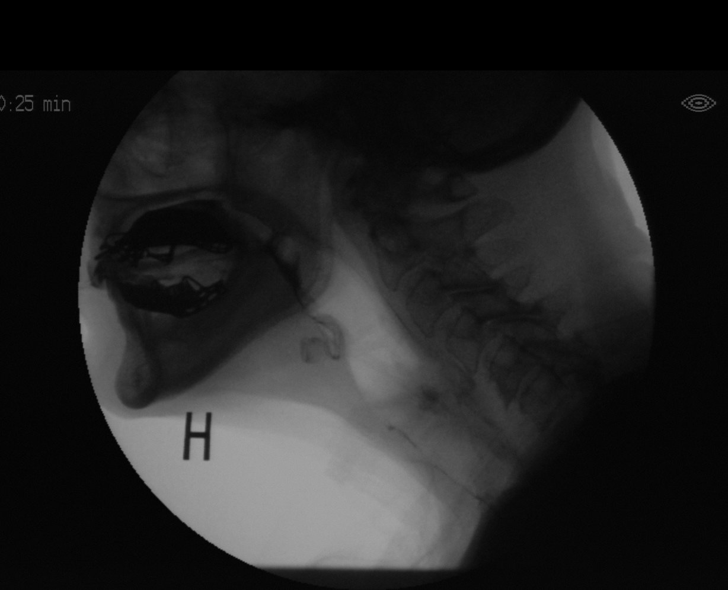

[Series 8: run · 1 of 148 frames shown (6 of 12)]
[frame 75/148]
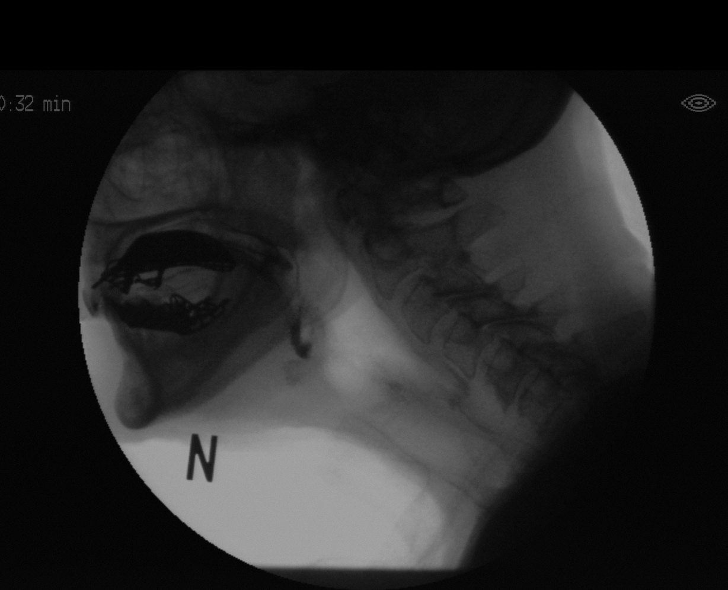

[Series 10: run · 1 of 121 frames shown (7 of 12)]
[frame 1/121]
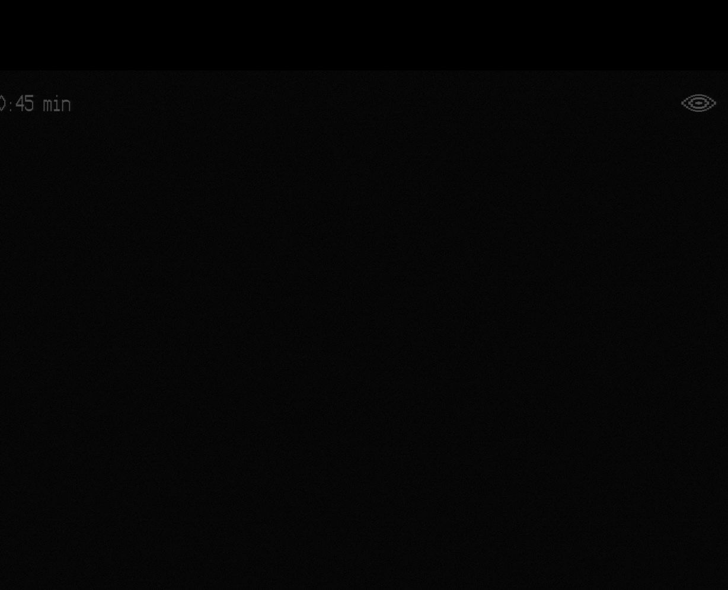

[Series 11: run · 1 of 113 frames shown (8 of 12)]
[frame 57/113]
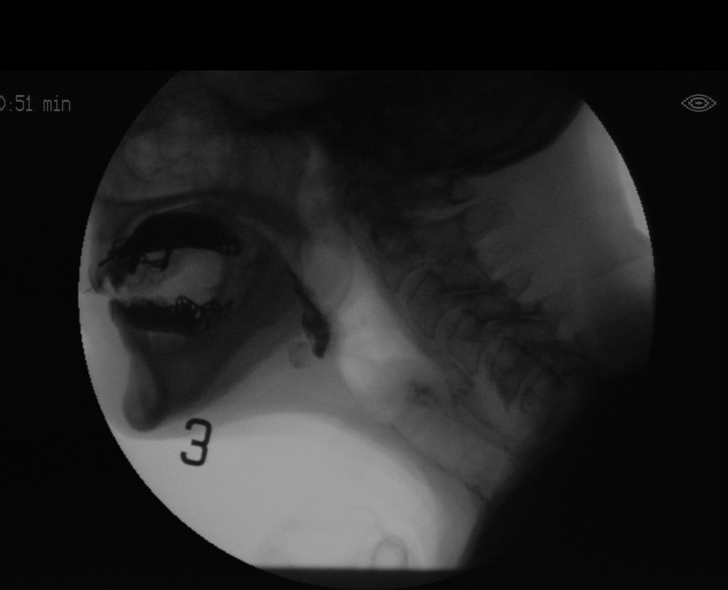

[Series 12: run · 1 of 93 frames shown (9 of 12)]
[frame 80/93]
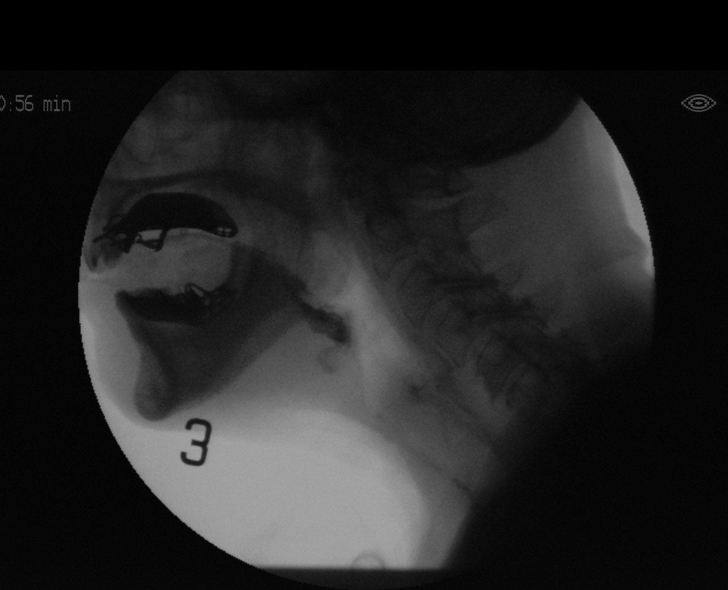

[Series 14: run · 1 of 160 frames shown (10 of 12)]
[frame 25/160]
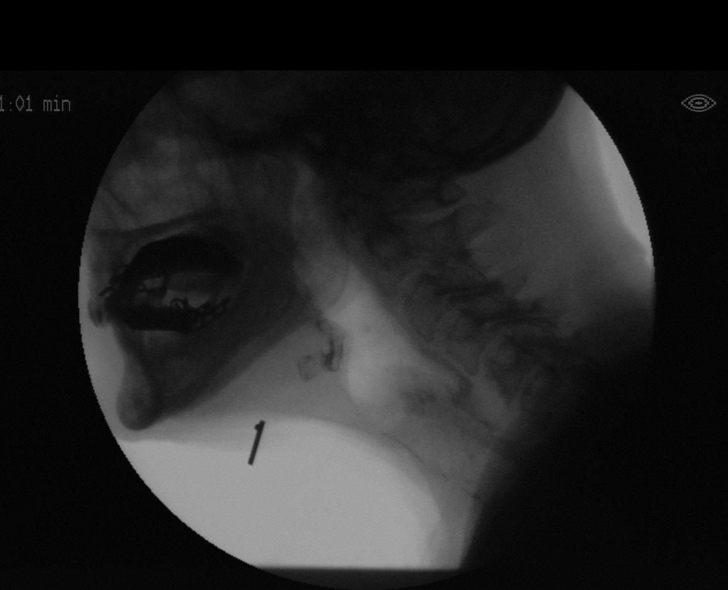

[Series 15: run · 1 of 20 frames shown (11 of 12)]
[frame 16/20]
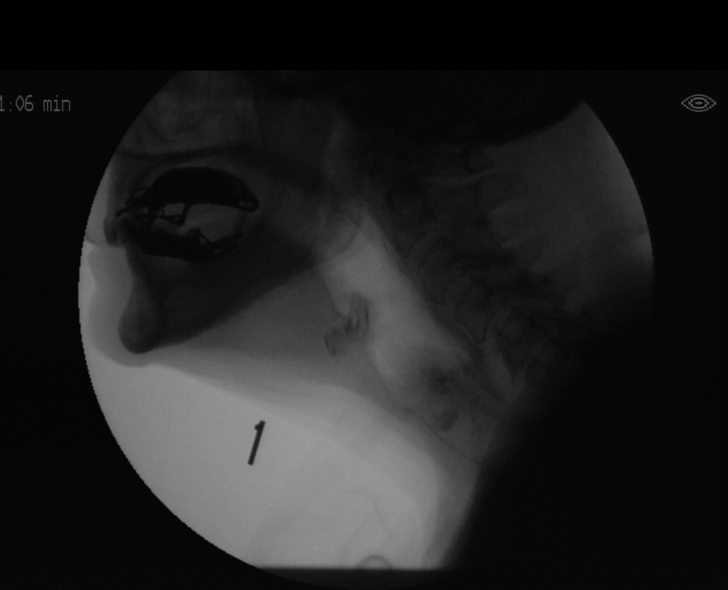

[Series 16: run · 1 of 131 frames shown (12 of 12)]
[frame 112/131]
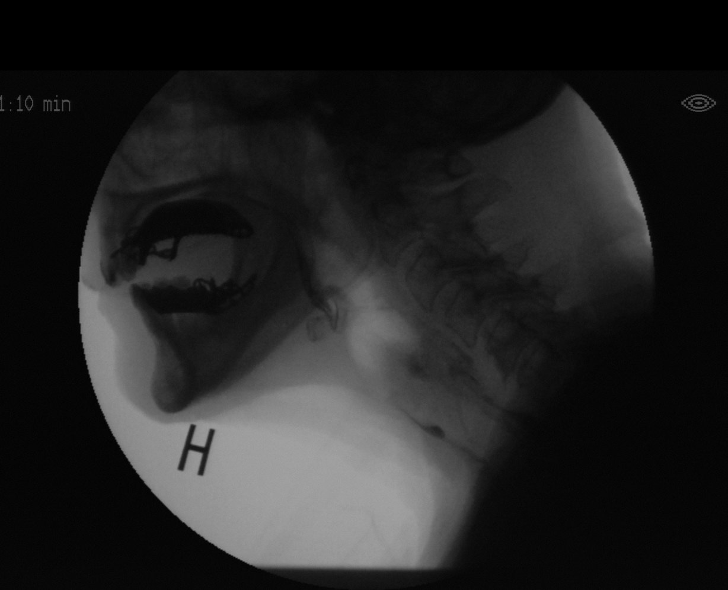

[12 of 24 positions shown; findings below may reference images not displayed]

IMPRESSION: Please refer to the Speech Pathologists report for complete details
and recommendations.
